# Patient Record
Sex: Female | Born: 1995 | Race: Black or African American | Hispanic: Yes | Marital: Single
Health system: Southern US, Community
[De-identification: ages and names within clinical notes are randomized; demographics above are authoritative.]

## PROBLEM LIST (undated history)

## (undated) ENCOUNTER — Inpatient Hospital Stay (HOSPITAL_COMMUNITY): Payer: Self-pay

## (undated) DIAGNOSIS — J45909 Unspecified asthma, uncomplicated: Secondary | ICD-10-CM

## (undated) DIAGNOSIS — A6000 Herpesviral infection of urogenital system, unspecified: Secondary | ICD-10-CM

## (undated) HISTORY — PX: CYST REMOVAL LEG: SHX6280

---
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
## 2016-05-13 ENCOUNTER — Ambulatory Visit (INDEPENDENT_AMBULATORY_CARE_PROVIDER_SITE_OTHER): Payer: Medicaid Other | Admitting: *Deleted

## 2016-05-13 DIAGNOSIS — N912 Amenorrhea, unspecified: Secondary | ICD-10-CM

## 2016-05-13 DIAGNOSIS — Z3202 Encounter for pregnancy test, result negative: Secondary | ICD-10-CM | POA: Diagnosis not present

## 2016-05-13 LAB — POCT URINE PREGNANCY: Preg Test, Ur: NEGATIVE

## 2016-05-13 NOTE — Progress Notes (Signed)
Patient is not using birth control- never used birth control. She has always had normal cycle. She is 32 days late with negative home test. Her test in our office is negative also. Advised she needs appointment with gyn.

## 2016-05-30 ENCOUNTER — Ambulatory Visit: Payer: Medicaid Other | Admitting: Certified Nurse Midwife

## 2016-06-27 ENCOUNTER — Encounter (HOSPITAL_COMMUNITY): Payer: Self-pay

## 2016-06-27 ENCOUNTER — Emergency Department (HOSPITAL_COMMUNITY)
Admission: EM | Admit: 2016-06-27 | Discharge: 2016-06-27 | Disposition: A | Discharge status: Home / Self Care | Payer: Medicaid Other | Attending: Emergency Medicine | Admitting: Emergency Medicine

## 2016-06-27 ENCOUNTER — Emergency Department (HOSPITAL_COMMUNITY): Payer: Medicaid Other

## 2016-06-27 DIAGNOSIS — Z79899 Other long term (current) drug therapy: Secondary | ICD-10-CM | POA: Diagnosis not present

## 2016-06-27 DIAGNOSIS — Z7722 Contact with and (suspected) exposure to environmental tobacco smoke (acute) (chronic): Secondary | ICD-10-CM | POA: Diagnosis not present

## 2016-06-27 DIAGNOSIS — R05 Cough: Secondary | ICD-10-CM | POA: Insufficient documentation

## 2016-06-27 DIAGNOSIS — J45909 Unspecified asthma, uncomplicated: Secondary | ICD-10-CM | POA: Diagnosis not present

## 2016-06-27 DIAGNOSIS — R059 Cough, unspecified: Secondary | ICD-10-CM

## 2016-06-27 MED ORDER — ALBUTEROL SULFATE HFA 108 (90 BASE) MCG/ACT IN AERS
2.0000 | INHALATION_SPRAY | Freq: Once | RESPIRATORY_TRACT | Status: AC
  Administered 2016-06-27: 2 via RESPIRATORY_TRACT
  Filled 2016-06-27: qty 6.7

## 2016-06-27 MED ORDER — LORATADINE-PSEUDOEPHEDRINE ER 5-120 MG PO TB12: 1.0000 | ORAL_TABLET | Freq: Two times a day (BID) | ORAL | 0 refills | Status: DC

## 2016-06-27 NOTE — ED Triage Notes (Signed)
Per Pt, Pt reports having cough x 2 months with small productive cough with some mucous. Denies fever. Reports OTC medication with no relief.

## 2016-06-27 NOTE — ED Provider Notes (Signed)
[facility]EM[facility]ENCY DEPT Provider Note   CSN: [id number] Arrival date & time: 06/27/16  1730  By signing my name below, I, Teofilo Pod, attest that this documentation has been prepared under the direction and in the presence of Rudean Icenhour, PA-C. Electronically Signed: Teofilo Pod, ED Scribe. 06/27/2016. 7:34 PM.    History   Chief Complaint Chief Complaint  Patient presents with  . Cough   The history is provided by the patient. No language interpreter was used.   HPI Comments:  Angel Daugherty is a 21 y.o. female with PMHx of asthma who presents to the Emergency Department complaining of a persistent cough x 2 month. Pt complains of associated intermittent rhinorrhea, congestion, and sore throat but states that she is not having rhinorrhea at this time. States sore throat worse with coughing. Denies fever, chills, malaise. Cough is non productive. Pt does not smoke. States exposed to 2nd hand smoke. Pt has taken multiple OTC cough medications and tylenol PM with no relief. No sick contacts. States symptoms started with flu like symptoms. Denies acid reflux. No hx of the same. Nothing making her symptoms better or worse.   History reviewed. No pertinent past medical history.  There are no active problems to display for this patient.   Past Surgical History:  Procedure Laterality Date  . CYST REMOVAL LEG    . PILONIDAL CYST EXCISION  August 2016  . PILONIDAL CYST EXCISION N/A 05/18/2015   Procedure: REXCISION OF PILONIDAL CYST ;  Surgeon: Glenna Fellows, MD;  Location: [facility];  Service: General;  Laterality: N/A;    OB History    Gravida Para Term Preterm AB Living   1             SAB TAB Ectopic Multiple Live Births                   Home Medications    Prior to Admission medications   Medication Sig Start Date End Date Taking? Authorizing Provider  amoxicillin (AMOXIL) 500 MG capsule Take 500 mg by mouth 2 (two) times daily. 09/27/15   Historical  Provider, MD  HYDROcodone-acetaminophen (NORCO/VICODIN) 5-325 MG tablet Take 1 tablet by mouth 4 (four) times daily. 09/27/15   Historical Provider, MD  oxyCODONE-acetaminophen (PERCOCET/ROXICET) 5-325 MG tablet Take 1 tablet by mouth every 4 (four) hours as needed for severe pain. 10/02/15   Cheri Fowler, PA-C  permethrin (ELIMITE) 5 % cream Apply 1 application topically once. Apply from the neck down coating your body and all the crevices. Do not apply to your face. Repeat the process in 1 week. Patient not taking: Reported on 10/02/2015 09/12/15   Arthor Captain, PA-C  Prenatal Vit-Fe Phos-FA-Omega (VITAFOL GUMMIES) 3.33-0.333-34.8 MG CHEW Chew 3 tablets by mouth daily. 08/28/15   Historical Provider, MD  triamcinolone cream (KENALOG) 0.1 % Apply 1 application topically 2 (two) times daily. 09/12/15   Arthor Captain, PA-C    Family History No family history on file.  Social History Social History  Substance Use Topics  . Smoking status: Passive Smoke Exposure - Never Smoker    Years: 18.00  . Smokeless tobacco: Never Used  . Alcohol use No     Allergies   Hydrocodone   Review of Systems Review of Systems  Constitutional: Negative for chills and fever.  HENT: Positive for congestion, rhinorrhea and sore throat.   Respiratory: Positive for cough.   Musculoskeletal: Negative for myalgias.  Neurological: Negative for headaches.  Physical Exam Updated Vital Signs BP 129/77 (BP Location: Left Arm)   Pulse 86   Temp 98.7 F (37.1 C) (Oral)   Resp 18   Ht 5\' 1"  (1.549 m)   Wt 150 lb (68 kg)   LMP 03/06/2016 (Within Weeks)   SpO2 100%   BMI 28.34 kg/m   Physical Exam  Constitutional: She is oriented to person, place, and time. She appears well-developed and well-nourished. No distress.  HENT:  Head: Normocephalic and atraumatic.  Right Ear: Tympanic membrane, external ear and ear canal normal.  Left Ear: Tympanic membrane, external ear and ear canal normal.  Nose:  Mucosal edema and rhinorrhea present.  Mouth/Throat: Uvula is midline and mucous membranes are normal. No oropharyngeal exudate, posterior oropharyngeal edema, posterior oropharyngeal erythema or tonsillar abscesses.  Eyes: Conjunctivae are normal.  Neck: Neck supple.  Cardiovascular: Normal rate, regular rhythm, normal heart sounds and intact distal pulses.   Pulmonary/Chest: Effort normal and breath sounds normal. No respiratory distress. She has no wheezes. She has no rales.  Abdominal: She exhibits no distension.  Musculoskeletal: Normal range of motion.  Neurological: She is alert and oriented to person, place, and time.  Skin: Skin is warm and dry.  Psychiatric: She has a normal mood and affect.  Nursing note and vitals reviewed.    ED Treatments / Results  DIAGNOSTIC STUDIES:  Oxygen Saturation is 100% on RA, normal by my interpretation.    COORDINATION OF CARE:  7:34 PM Discussed treatment plan with pt at bedside and pt agreed to plan.   Labs (all labs ordered are listed, but only abnormal results are displayed) Labs Reviewed - No data to display  EKG  EKG Interpretation None       Radiology Dg Chest 2 View  Result Date: 06/27/2016 CLINICAL DATA:  Consistent cough occasionally productive EXAM: CHEST  2 VIEW COMPARISON:  08/10/2004 FINDINGS: The heart size and mediastinal contours are within normal limits. Both lungs are clear. The visualized skeletal structures are unremarkable. IMPRESSION: No active cardiopulmonary disease. Electronically Signed   By: Jasmine PangKim  Fujinaga M.D.   On: 06/27/2016 19:18    Procedures Procedures (including critical care time)  Medications Ordered in ED Medications - No data to display   Initial Impression / Assessment and Plan / ED Course  I have reviewed the triage vital signs and the nursing notes.  Pertinent labs & imaging results that were available during my care of the patient were reviewed by me and considered in my medical  decision making (see chart for details).  Clinical Course   Patient in emergency department for cough for 2 months. Reports some nasal congestion, otherwise no other associated symptoms. Vital signs are normal. No wheezing on exam, lungs are clear. Chest x-ray obtained and is negative. Question postnasal drainage as the cause of her symptoms versus acid reflux. Instructed to cut out spicy foods, sleep propped up on the pillows, will start on Claritin-D and inhaler. Follow with primary care doctor if not improving. Patient is not toxic appearing, stable for discharge home. No chest pain, shortness of breath, no fever, not hypoxic, not tachycardic,  PERC negative.   Vitals:   06/27/16 1826  BP: 129/77  Pulse: 86  Resp: 18  Temp: 98.7 F (37.1 C)  TempSrc: Oral  SpO2: 100%  Weight: 68 kg  Height: 5\' 1"  (1.549 m)    Final Clinical Impressions(s) / ED Diagnoses   Final diagnoses:  Cough    New Prescriptions New Prescriptions  LORATADINE-PSEUDOEPHEDRINE (CLARITIN-D 12 HOUR) 5-120 MG TABLET    Take 1 tablet by mouth 2 (two) times daily.   I personally performed the services described in this documentation, which was scribed in my presence. The recorded information has been reviewed and is accurate.     Jaynie Crumble, PA-C 06/27/16 1950    Canary Brim Tegeler, MD 06/27/16 2249

## 2016-06-27 NOTE — ED Notes (Signed)
Patient transported to X-ray 

## 2016-06-27 NOTE — Discharge Instructions (Signed)
Take inhaler 2 puffs every 4 hrs. Claritin D for congestion and post nasal drainage. Follow up with primary care doctor if not improving.

## 2016-12-10 ENCOUNTER — Ambulatory Visit: Payer: Medicaid Other | Admitting: Obstetrics

## 2017-06-17 DIAGNOSIS — A6000 Herpesviral infection of urogenital system, unspecified: Secondary | ICD-10-CM

## 2017-06-17 HISTORY — DX: Herpesviral infection of urogenital system, unspecified: A60.00

## 2017-08-16 ENCOUNTER — Emergency Department (HOSPITAL_COMMUNITY): Payer: Medicaid Other

## 2017-08-16 ENCOUNTER — Emergency Department (HOSPITAL_COMMUNITY)
Admission: EM | Admit: 2017-08-16 | Discharge: 2017-08-16 | Disposition: A | Discharge status: Home / Self Care | Payer: Medicaid Other | Attending: Emergency Medicine | Admitting: Emergency Medicine

## 2017-08-16 ENCOUNTER — Encounter (HOSPITAL_COMMUNITY): Payer: Self-pay

## 2017-08-16 DIAGNOSIS — R197 Diarrhea, unspecified: Secondary | ICD-10-CM | POA: Diagnosis not present

## 2017-08-16 DIAGNOSIS — J069 Acute upper respiratory infection, unspecified: Secondary | ICD-10-CM | POA: Diagnosis not present

## 2017-08-16 DIAGNOSIS — Z7722 Contact with and (suspected) exposure to environmental tobacco smoke (acute) (chronic): Secondary | ICD-10-CM | POA: Diagnosis not present

## 2017-08-16 DIAGNOSIS — J45909 Unspecified asthma, uncomplicated: Secondary | ICD-10-CM | POA: Diagnosis not present

## 2017-08-16 DIAGNOSIS — R112 Nausea with vomiting, unspecified: Secondary | ICD-10-CM

## 2017-08-16 DIAGNOSIS — R05 Cough: Secondary | ICD-10-CM | POA: Diagnosis present

## 2017-08-16 DIAGNOSIS — B9789 Other viral agents as the cause of diseases classified elsewhere: Secondary | ICD-10-CM | POA: Diagnosis not present

## 2017-08-16 DIAGNOSIS — Z79899 Other long term (current) drug therapy: Secondary | ICD-10-CM | POA: Insufficient documentation

## 2017-08-16 HISTORY — DX: Unspecified asthma, uncomplicated: J45.909

## 2017-08-16 LAB — COMPREHENSIVE METABOLIC PANEL
ALT: 14 U/L (ref 14–54)
AST: 18 U/L (ref 15–41)
Albumin: 3.9 g/dL (ref 3.5–5.0)
Alkaline Phosphatase: 65 U/L (ref 38–126)
Anion gap: 12 (ref 5–15)
BUN: 8 mg/dL (ref 6–20)
CO2: 21 mmol/L — ABNORMAL LOW (ref 22–32)
Calcium: 9 mg/dL (ref 8.9–10.3)
Chloride: 103 mmol/L (ref 101–111)
Creatinine, Ser: 0.68 mg/dL (ref 0.44–1.00)
GFR calc Af Amer: >60 mL/min (ref >60)
GFR calc non Af Amer: >60 mL/min (ref >60)
Glucose, Bld: 88 mg/dL (ref 65–99)
Potassium: 3.6 mmol/L (ref 3.5–5.1)
Sodium: 136 mmol/L (ref 135–145)
Total Bilirubin: 0.7 mg/dL (ref 0.3–1.2)
Total Protein: 8.1 g/dL (ref 6.5–8.1)

## 2017-08-16 LAB — CBC WITH DIFFERENTIAL/PLATELET
Basophils Absolute: 0 10*3/uL (ref 0.0–0.1)
Basophils Relative: 0 %
Eosinophils Absolute: 0 10*3/uL (ref 0.0–0.7)
Eosinophils Relative: 0 %
HCT: 36 % (ref 36.0–46.0)
Hemoglobin: 12.1 g/dL (ref 12.0–15.0)
Lymphocytes Relative: 9 %
Lymphs Abs: 0.9 10*3/uL (ref 0.7–4.0)
MCH: 30 pg (ref 26.0–34.0)
MCHC: 33.6 g/dL (ref 30.0–36.0)
MCV: 89.1 fL (ref 78.0–100.0)
Monocytes Absolute: 1.1 10*3/uL — ABNORMAL HIGH (ref 0.1–1.0)
Monocytes Relative: 11 %
Neutro Abs: 7.8 10*3/uL — ABNORMAL HIGH (ref 1.7–7.7)
Neutrophils Relative %: 80 %
Platelets: 263 10*3/uL (ref 150–400)
RBC: 4.04 MIL/uL (ref 3.87–5.11)
RDW: 12.4 % (ref 11.5–15.5)
WBC: 9.7 10*3/uL (ref 4.0–10.5)

## 2017-08-16 LAB — POC URINE PREG, ED: Preg Test, Ur: NEGATIVE

## 2017-08-16 MED ORDER — SODIUM CHLORIDE 0.9 % IV BOLUS (SEPSIS)
1000.0000 mL | Freq: Once | INTRAVENOUS | Status: AC
  Administered 2017-08-16: 1000 mL via INTRAVENOUS

## 2017-08-16 MED ORDER — ONDANSETRON 4 MG PO TBDP: 4.0000 mg | ORAL_TABLET | Freq: Three times a day (TID) | ORAL | 0 refills | Status: DC | PRN

## 2017-08-16 MED ORDER — ONDANSETRON HCL 4 MG/2ML IJ SOLN
4.0000 mg | Freq: Once | INTRAMUSCULAR | Status: AC
  Administered 2017-08-16: 4 mg via INTRAVENOUS
  Filled 2017-08-16: qty 2

## 2017-08-16 MED ORDER — ONDANSETRON 4 MG PO TBDP: 4.0000 mg | ORAL_TABLET | Freq: Once | ORAL | Status: DC

## 2017-08-16 MED ORDER — BENZONATATE 100 MG PO CAPS: 100.0000 mg | ORAL_CAPSULE | Freq: Three times a day (TID) | ORAL | 0 refills | Status: DC

## 2017-08-16 MED ORDER — IPRATROPIUM-ALBUTEROL 0.5-2.5 (3) MG/3ML IN SOLN
3.0000 mL | Freq: Once | RESPIRATORY_TRACT | Status: AC
  Administered 2017-08-16: 3 mL via RESPIRATORY_TRACT
  Filled 2017-08-16: qty 3

## 2017-08-16 NOTE — Discharge Instructions (Signed)
Please read instructions below. Drink clear liquids until your stomach feels better. Then, slowly introduce bland foods into your diet as tolerated, such as bread, rice, apples, bananas. You can take zofran every 8 hours as needed for nausea. You can take tylenol or advil as needed for sore throat or body aches.  You can take tessalon every 8 hours as needed for cough. Follow up with your primary care if symptoms persist. Return to the ER for severe abdominal pain, fever, uncontrollable vomiting, or new or concerning symptoms.

## 2017-08-16 NOTE — ED Triage Notes (Signed)
Patient complains of 3 days of cough with nausea and vomiting. Complains of fatigue and weakness. Alert and oriented.

## 2017-08-16 NOTE — ED Provider Notes (Signed)
[facility] Provider Note   CSN: [id number] Arrival date & time: 08/16/17  0840     History   Chief Complaint No chief complaint on file.   HPI Angel Daugherty is a 22 y.o. female with past medical history of asthma, presenting to the ED with 3 days of cough, congestion, as well as nausea vomiting and diarrhea.  Patient states she does have positive sick contacts at home.  She has had hot and cold flashes as well, however has not taken her temperature.  Taking over-the-counter medications for URI symptoms without much relief.  Dates she initially had a sore throat which has resolved.  Has also been using her albuterol inhaler as prescribed.  Denies shortness of breath, abdominal pain, urinary symptoms, difficulty swallowing or breathing, ear pain, or other complaints.  Has not had influenza vaccine this season.  The history is provided by the patient.    Past Medical History:  Diagnosis Date  . Asthma     There are no active problems to display for this patient.   Past Surgical History:  Procedure Laterality Date  . CYST REMOVAL LEG    . PILONIDAL CYST EXCISION  August 2016  . PILONIDAL CYST EXCISION N/A 05/18/2015   Procedure: REXCISION OF PILONIDAL CYST ;  Surgeon: Glenna FellowsBenjamin Hoxworth, MD;  Location: [facility];  Service: General;  Laterality: N/A;    OB History    Gravida Para Term Preterm AB Living   1             SAB TAB Ectopic Multiple Live Births                   Home Medications    Prior to Admission medications   Medication Sig Start Date End Date Taking? Authorizing Provider  amoxicillin (AMOXIL) 500 MG capsule Take 500 mg by mouth 2 (two) times daily. 09/27/15   [provider]  benzonatate (TESSALON) 100 MG capsule Take 1 capsule (100 mg total) by mouth every 8 (eight) hours. 08/16/17   Aleshia Cartelli, [place] N, PA-C  HYDROcodone-acetaminophen (NORCO/VICODIN) 5-325 MG tablet Take 1 tablet by mouth 4 (four) times daily.  09/27/15   [provider]  loratadine-pseudoephedrine (CLARITIN-D 12 HOUR) 5-120 MG tablet Take 1 tablet by mouth 2 (two) times daily. 06/27/16   Kirichenko, Tatyana, PA-C  ondansetron (ZOFRAN ODT) 4 MG disintegrating tablet Take 1 tablet (4 mg total) by mouth every 8 (eight) hours as needed for nausea or vomiting. 08/16/17   Brodric Schauer, [place] N, PA-C  oxyCODONE-acetaminophen (PERCOCET/ROXICET) 5-325 MG tablet Take 1 tablet by mouth every 4 (four) hours as needed for severe pain. 10/02/15   Cheri Fowlerose, Kayla, PA-C  permethrin (ELIMITE) 5 % cream Apply 1 application topically once. Apply from the neck down coating your body and all the crevices. Do not apply to your face. Repeat the process in 1 week. Patient not taking: Reported on 10/02/2015 09/12/15   Arthor CaptainHarris, Abigail, PA-C  Prenatal Vit-Fe Phos-FA-Omega (VITAFOL GUMMIES) 3.33-0.333-34.8 MG CHEW Chew 3 tablets by mouth daily. 08/28/15   [provider]  triamcinolone cream (KENALOG) 0.1 % Apply 1 application topically 2 (two) times daily. 09/12/15   Arthor CaptainHarris, Abigail, PA-C    Family History No family history on file.  Social History Social History   Tobacco Use  . Smoking status: Passive Smoke Exposure - Never Smoker  . Smokeless tobacco: Never Used  Substance Use Topics  . Alcohol use: No  . Drug use: No  Allergies   Hydrocodone   Review of Systems Review of Systems  Constitutional: Positive for chills and fever (Subjective).  HENT: Positive for congestion and sore throat. Negative for ear pain, trouble swallowing and voice change.   Respiratory: Positive for cough. Negative for shortness of breath.   Gastrointestinal: Positive for diarrhea, nausea and vomiting. Negative for abdominal pain and blood in stool.  Genitourinary: Negative for dysuria and frequency.  All other systems reviewed and are negative.    Physical Exam Updated Vital Signs BP (!) 125/59 (BP Location: Right Arm)   Pulse (!) 117   Temp 99.8 F (37.7  C) (Oral)   Resp 18   LMP 07/17/2017 (Approximate)   SpO2 97%   Physical Exam  Constitutional: She appears well-developed and well-nourished. No distress.  HENT:  Head: Normocephalic and atraumatic.  Right Ear: Tympanic membrane, external ear and ear canal normal.  Left Ear: Tympanic membrane, external ear and ear canal normal.  Mouth/Throat: Uvula is midline and oropharynx is clear and moist. No trismus in the jaw. No uvula swelling.  Nose is congested  Eyes: Conjunctivae are normal.  Neck: Normal range of motion. Neck supple.  Cardiovascular: Regular rhythm, normal heart sounds and intact distal pulses.  Mildly tachycardic.  Pulmonary/Chest: Effort normal and breath sounds normal. No stridor. No respiratory distress. She has no wheezes. She has no rales.  Abdominal: Soft. Bowel sounds are normal. She exhibits no distension. There is no tenderness. There is no rebound and no guarding.  Lymphadenopathy:    She has no cervical adenopathy.  Neurological: She is alert.  Skin: Skin is warm.  Psychiatric: She has a normal mood and affect. Her behavior is normal.  Nursing note and vitals reviewed.    ED Treatments / Results  Labs (all labs ordered are listed, but only abnormal results are displayed) Labs Reviewed  COMPREHENSIVE METABOLIC PANEL - Abnormal; Notable for the following components:      Result Value   CO2 21 (*)    All other components within normal limits  CBC WITH DIFFERENTIAL/PLATELET - Abnormal; Notable for the following components:   Neutro Abs 7.8 (*)    Monocytes Absolute 1.1 (*)    All other components within normal limits  POC URINE PREG, ED    EKG  EKG Interpretation None       Radiology Dg Chest 2 View  Result Date: 08/16/2017 CLINICAL DATA:  Cough for 3 days. EXAM: CHEST  2 VIEW COMPARISON:  None. FINDINGS: June 27, 2016 IMPRESSION: No active cardiopulmonary disease. Electronically Signed   By: Gerome Sam III M.D   On: 08/16/2017 10:00     Procedures Procedures (including critical care time)  Medications Ordered in ED Medications  ipratropium-albuterol (DUONEB) 0.5-2.5 (3) MG/3ML nebulizer solution 3 mL (3 mLs Nebulization Given 08/16/17 1007)  sodium chloride 0.9 % bolus 1,000 mL (0 mLs Intravenous Stopped 08/16/17 1052)  ondansetron (ZOFRAN) injection 4 mg (4 mg Intravenous Given 08/16/17 1007)     Initial Impression / Assessment and Plan / ED Course  I have reviewed the triage vital signs and the nursing notes.  Pertinent labs & imaging results that were available during my care of the patient were reviewed by me and considered in my medical decision making (see chart for details).     Patient presenting with URI symptoms and vomiting and diarrhea.  Suspect viral etiology of symptoms.  Afebrile, however with tachycardia on arrival.  Lungs clear to auscultation bilaterally, ENT exam unremarkable.  Abdomen  soft and nontender.  DuoNeb given with improvement in cough.  CBC without leukocytosis.  CMP unremarkable.  Chest x-ray negative for acute infiltrate.  Patient treated in the ED with IV fluids and Zofran.  Patient reports significant relief and nausea.  Only complaint is persistent cough.  Tachycardia improved following fluids.  Patient is well-appearing, nontoxic, not in distress.  Patient is safe for discharge with symptomatic treatment and follow-up with her PCP.  discussed importance of oral rehydration, will also send with cough suppressant.  Discussed results, findings, treatment and follow up. Patient advised of return precautions. Patient verbalized understanding and agreed with plan.  Final Clinical Impressions(s) / ED Diagnoses   Final diagnoses:  Viral URI with cough  Nausea vomiting and diarrhea    ED Discharge Orders        Ordered    benzonatate (TESSALON) 100 MG capsule  Every 8 hours     08/16/17 1154    ondansetron (ZOFRAN ODT) 4 MG disintegrating tablet  Every 8 hours PRN     08/16/17 1154        Weylyn Ricciuti, [place] N, PA-C 08/16/17 1155    Rolan Bucco, MD 08/16/17 1159

## 2018-01-16 IMAGING — CT CT PELVIS W/ CM
2 of 3 series · 15 of 46 positions shown, 17 images · IV contrast (iopamidol)
Comparison: None.

CLINICAL DATA: Cellulitis/abscess left buttock. History of cyst in
that region.

EXAM:
CT PELVIS WITH CONTRAST
TECHNIQUE: Multidetector CT imaging of the pelvis was performed using the
standard protocol following the bolus administration of intravenous
contrast.
CONTRAST:  100mL 23PZOY-U77 IOPAMIDOL ([id number]-U77) INJECTION 61%

[Series 3: pelvis with · axial · 0.87mm/px · z∈[-332,-132]mm · 12 of 48 slices shown, 14 images]
[im 4/48  soft-tissue]
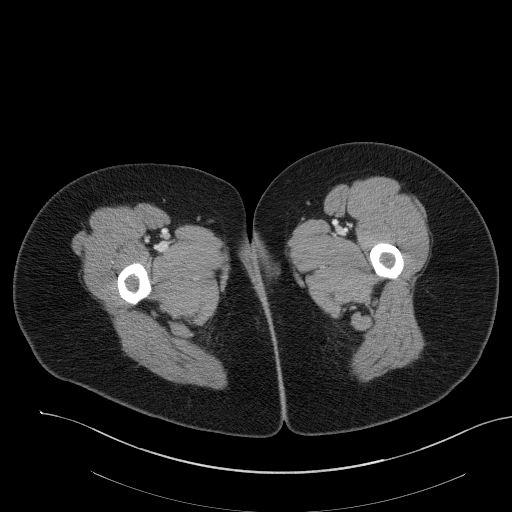
[im 4/48  bone]
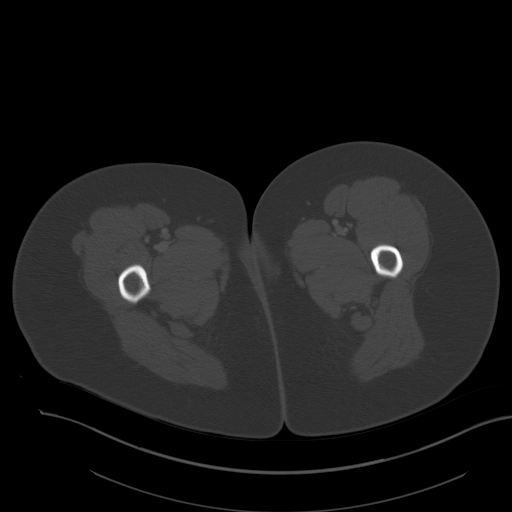
[im 7/48  soft-tissue]
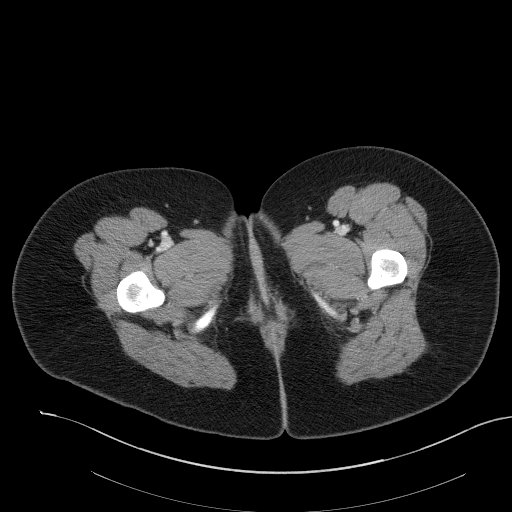
[im 11/48  soft-tissue]
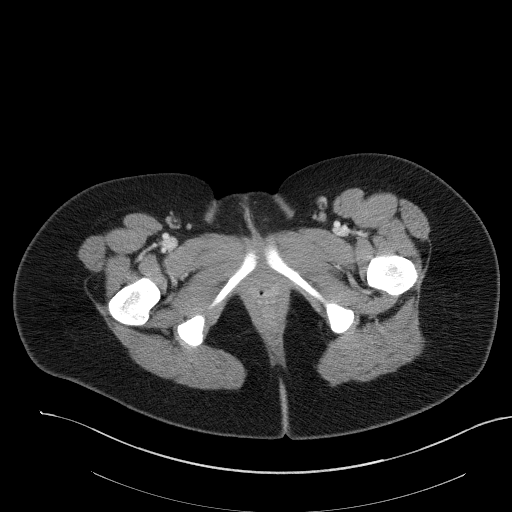
[im 14/48  soft-tissue]
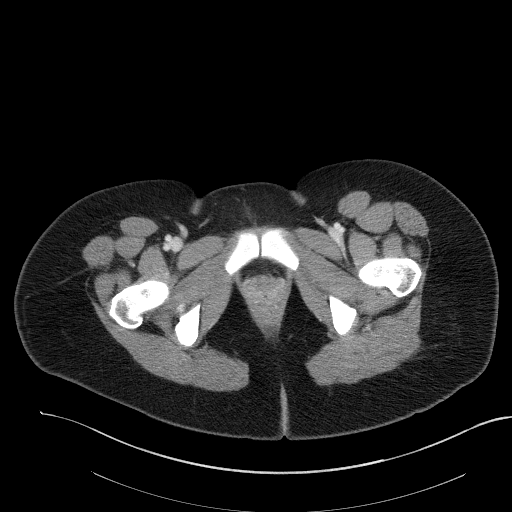
[im 19/48  soft-tissue]
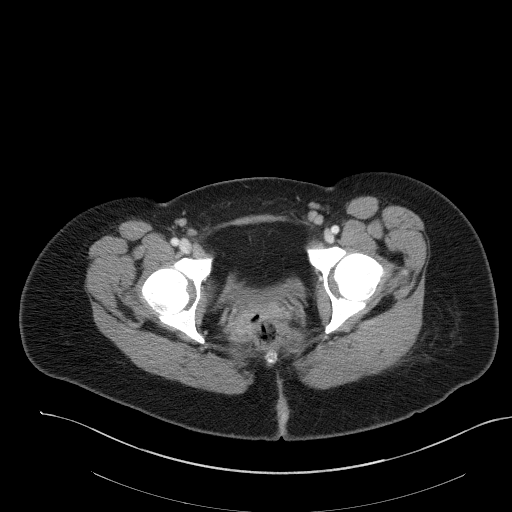
[im 22/48  soft-tissue]
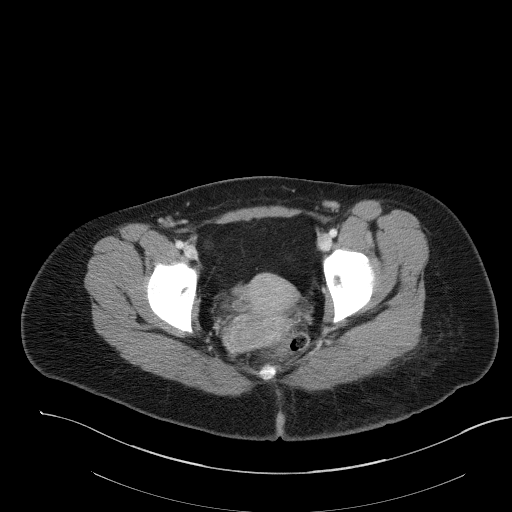
[im 26/48  soft-tissue]
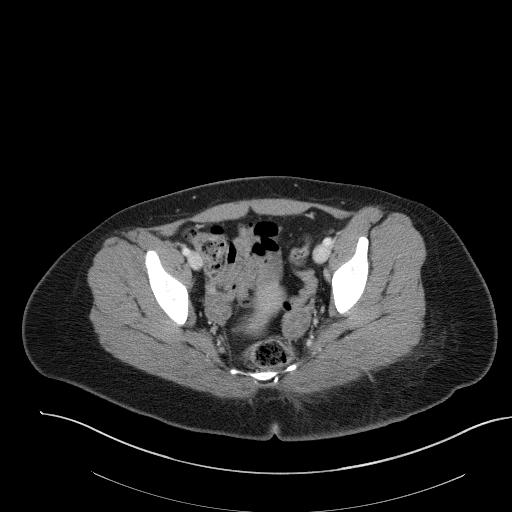
[im 29/48  soft-tissue]
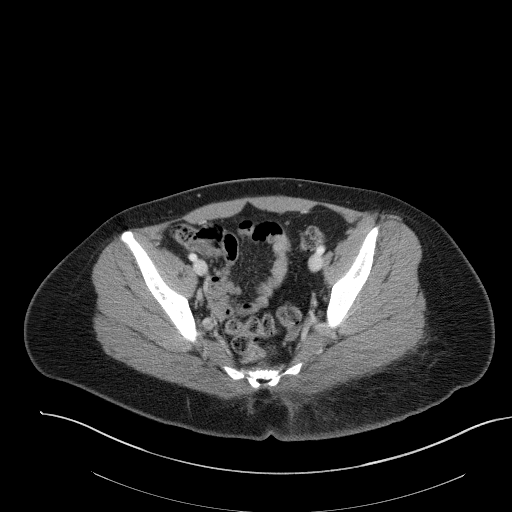
[im 34/48  soft-tissue]
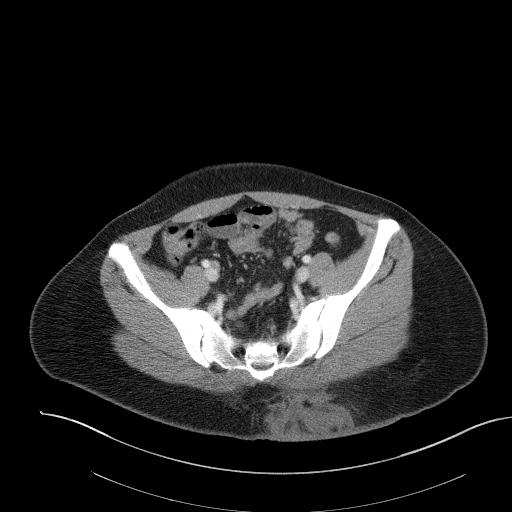
[im 34/48  bone]
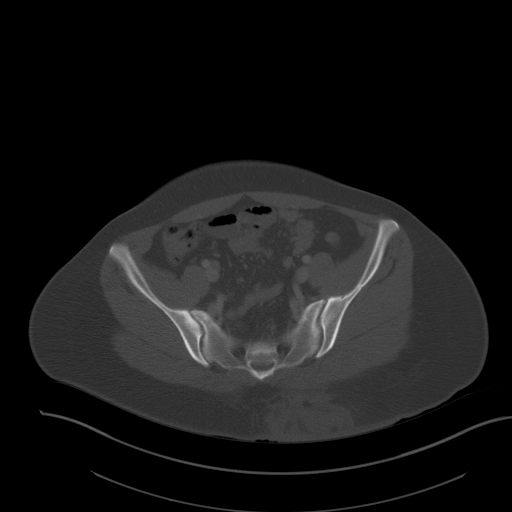
[im 37/48  soft-tissue]
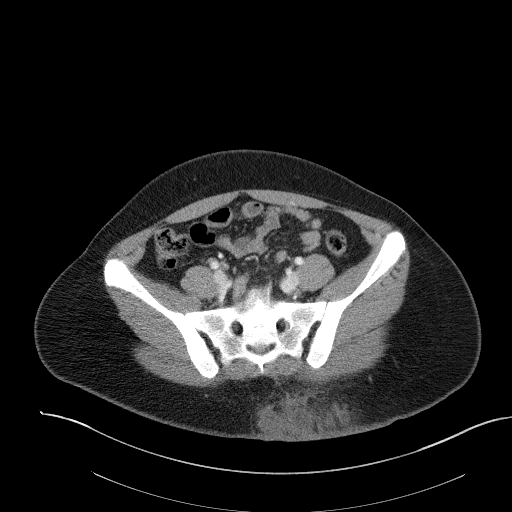
[im 41/48  soft-tissue]
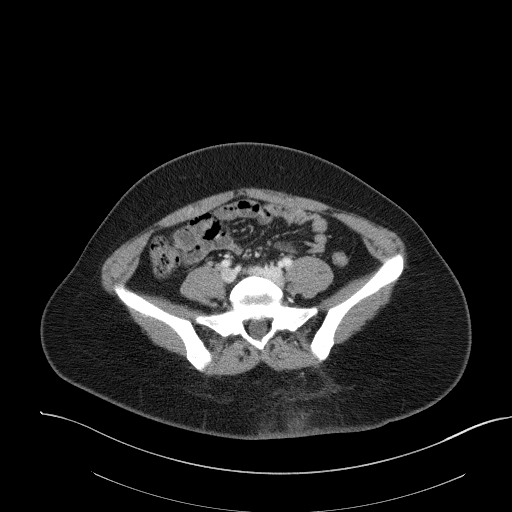
[im 44/48  soft-tissue]
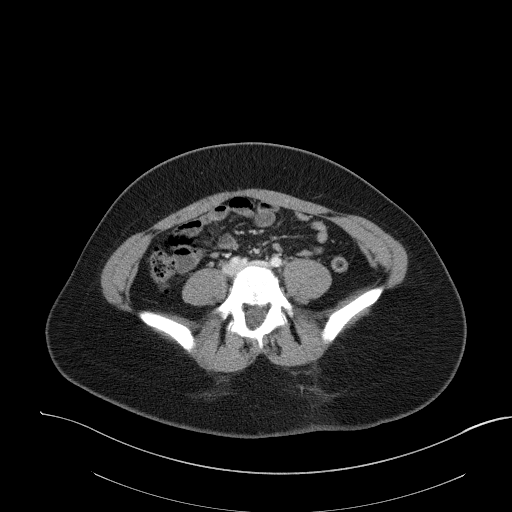

[Series 4: coronal images · coronal · 0.52mm/px · 3 of 129 slices shown]
[im 43/129  soft-tissue]
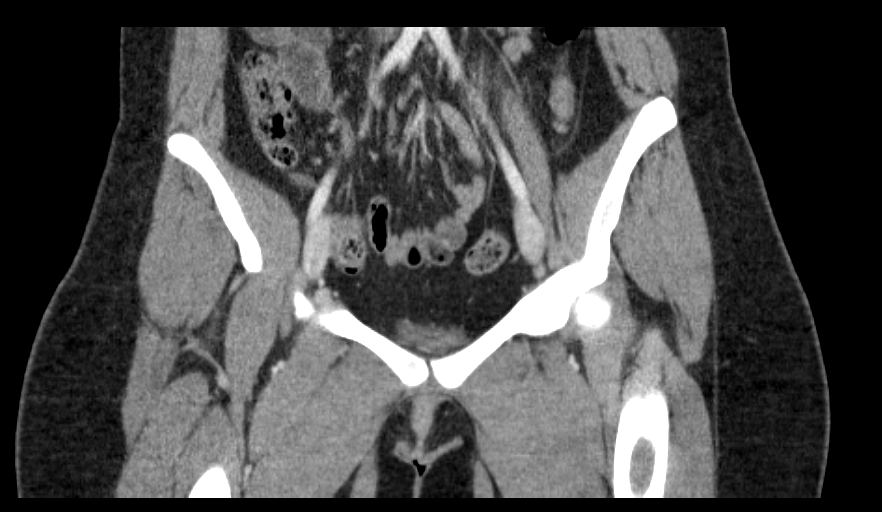
[im 57/129  soft-tissue]
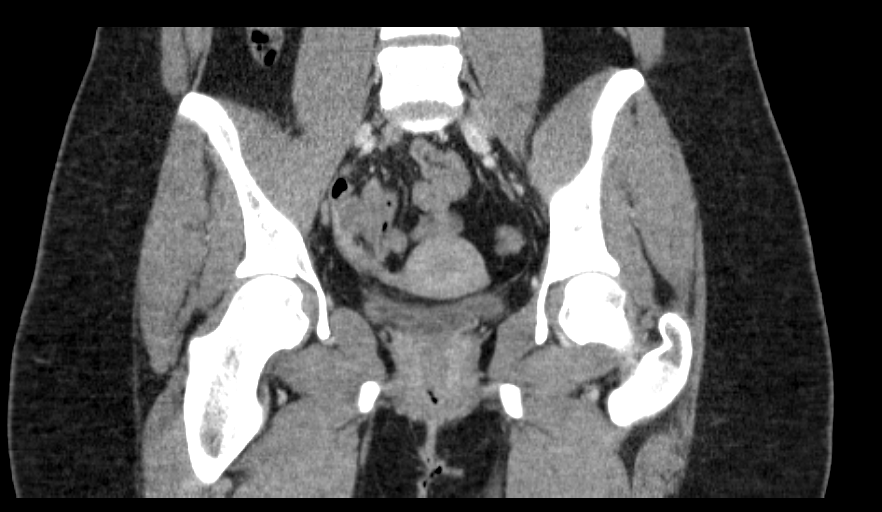
[im 72/129  soft-tissue]
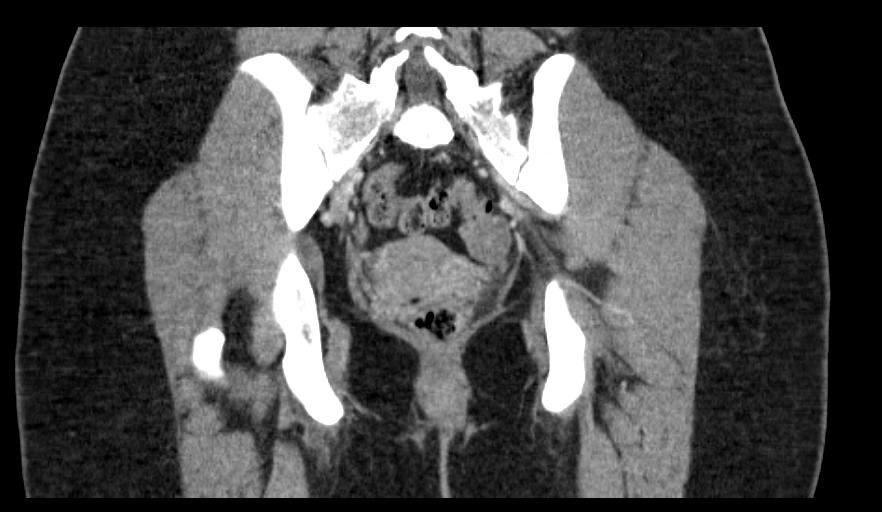

[15 of 46 positions shown; findings below may reference images not displayed]

FINDINGS: There is nonspecific indistinct fluid or soft tissue density
abnormality within the subcutaneous fat at the left buttock region
involving an area measuring approximately 6.6 cm cephalocaudal, 8 cm
right to left, and 5 cm front to back. This is primarily restricted
to the subcutaneous fatty tissues. There could be superficial
involvement/inflammation of the left gluteus musculature. Certainly
there is no evidence of deep extension or intramuscular abscess.
Bones of the region appear normal. No internal pelvic abnormality
seen. No evidence of radiopaque foreign object.
IMPRESSION: Nonspecific indistinctly marginated fluid or soft tissue density
material within the subcutaneous fat of the left buttock. Most
common explanations would be hematoma or cellulitis. No evidence of
well circumscribed drainable collection. Question mild inflammation
of the left gluteal musculature superficially. There was some
question in the history about a pre-existing lesion in this region
and there could certainly be an underlying hemangioma or other soft
tissue abnormality complicated by acute regional hemorrhage or
inflammation.

## 2018-03-25 ENCOUNTER — Inpatient Hospital Stay (HOSPITAL_COMMUNITY)
Admission: AD | Admit: 2018-03-25 | Discharge: 2018-03-25 | Disposition: A | Discharge status: Home / Self Care | Payer: Medicaid Other | Source: Ambulatory Visit | Attending: Obstetrics & Gynecology | Admitting: Obstetrics & Gynecology

## 2018-03-25 ENCOUNTER — Other Ambulatory Visit: Payer: Self-pay

## 2018-03-25 ENCOUNTER — Inpatient Hospital Stay (HOSPITAL_COMMUNITY): Payer: Medicaid Other

## 2018-03-25 ENCOUNTER — Encounter (HOSPITAL_COMMUNITY): Payer: Self-pay | Admitting: *Deleted

## 2018-03-25 DIAGNOSIS — Z3A01 Less than 8 weeks gestation of pregnancy: Secondary | ICD-10-CM | POA: Insufficient documentation

## 2018-03-25 DIAGNOSIS — R102 Pelvic and perineal pain: Secondary | ICD-10-CM

## 2018-03-25 DIAGNOSIS — B9689 Other specified bacterial agents as the cause of diseases classified elsewhere: Secondary | ICD-10-CM | POA: Diagnosis not present

## 2018-03-25 DIAGNOSIS — Z7722 Contact with and (suspected) exposure to environmental tobacco smoke (acute) (chronic): Secondary | ICD-10-CM | POA: Diagnosis not present

## 2018-03-25 DIAGNOSIS — R109 Unspecified abdominal pain: Secondary | ICD-10-CM | POA: Diagnosis present

## 2018-03-25 DIAGNOSIS — O23591 Infection of other part of genital tract in pregnancy, first trimester: Secondary | ICD-10-CM | POA: Diagnosis not present

## 2018-03-25 DIAGNOSIS — R1030 Lower abdominal pain, unspecified: Secondary | ICD-10-CM | POA: Diagnosis not present

## 2018-03-25 DIAGNOSIS — O26891 Other specified pregnancy related conditions, first trimester: Secondary | ICD-10-CM | POA: Diagnosis not present

## 2018-03-25 DIAGNOSIS — N760 Acute vaginitis: Secondary | ICD-10-CM

## 2018-03-25 DIAGNOSIS — O26899 Other specified pregnancy related conditions, unspecified trimester: Secondary | ICD-10-CM

## 2018-03-25 HISTORY — DX: Herpesviral infection of urogenital system, unspecified: A60.00

## 2018-03-25 LAB — CBC WITH DIFFERENTIAL/PLATELET
Basophils Absolute: 0 10*3/uL (ref 0.0–0.1)
Basophils Relative: 0 %
Eosinophils Absolute: 0 10*3/uL (ref 0.0–0.5)
Eosinophils Relative: 0 %
HCT: 36.3 % (ref 36.0–46.0)
Hemoglobin: 12.6 g/dL (ref 12.0–15.0)
Lymphocytes Relative: 23 %
Lymphs Abs: 1.8 10*3/uL (ref 0.7–4.0)
MCH: 29.9 pg (ref 26.0–34.0)
MCHC: 34.7 g/dL (ref 30.0–36.0)
MCV: 86.2 fL (ref 80.0–100.0)
Monocytes Absolute: 0.3 10*3/uL (ref 0.1–1.0)
Monocytes Relative: 4 %
Neutro Abs: 5.7 10*3/uL (ref 1.7–7.7)
Neutrophils Relative %: 73 %
Platelets: 309 10*3/uL (ref 150–400)
RBC: 4.21 MIL/uL (ref 3.87–5.11)
RDW: 12.3 % (ref 11.5–15.5)
WBC: 7.8 10*3/uL (ref 4.0–10.5)
nRBC: 0 % (ref 0.0–0.2)

## 2018-03-25 LAB — WET PREP, GENITAL
Sperm: NONE SEEN
Trich, Wet Prep: NONE SEEN
Yeast Wet Prep HPF POC: NONE SEEN

## 2018-03-25 LAB — URINALYSIS, ROUTINE W REFLEX MICROSCOPIC
Bilirubin Urine: NEGATIVE
Glucose, UA: NEGATIVE mg/dL
Hgb urine dipstick: NEGATIVE
Ketones, ur: NEGATIVE mg/dL
Leukocytes, UA: NEGATIVE
Nitrite: NEGATIVE
Protein, ur: NEGATIVE mg/dL
Specific Gravity, Urine: 1.027 (ref 1.005–1.030)
pH: 5 (ref 5.0–8.0)

## 2018-03-25 LAB — HCG, QUANTITATIVE, PREGNANCY: hCG, Beta Chain, Quant, S: 57 m[IU]/mL — ABNORMAL HIGH (ref <5)

## 2018-03-25 LAB — POCT PREGNANCY, URINE: Preg Test, Ur: POSITIVE — AB

## 2018-03-25 MED ORDER — PREPLUS 27-1 MG PO TABS: 1.0000 | ORAL_TABLET | Freq: Every day | ORAL | 13 refills | Status: DC

## 2018-03-25 MED ORDER — METRONIDAZOLE 500 MG PO TABS: 500.0000 mg | ORAL_TABLET | Freq: Two times a day (BID) | ORAL | 0 refills | Status: DC

## 2018-03-25 NOTE — MAU Provider Note (Signed)
[facility] Attending MAU Note  Chief Complaint: Abdominal Pain and Possible Pregnancy  First Provider Initiated Contact with Patient 03/25/18 1108     SUBJECTIVE Angel Daugherty is a 22 y.o. pregnant G3P0020 at unknown gestation who presents with lower abdominal pain and back pain for the past week.  Found out she was pregnant about a week ago, reported crampy lower abdominal pain radiating to her back on both sides. Denies any abnormal vaginal bleeding, discharge, fevers, chills, sweats, dysuria, nausea, vomiting, other GI or GU symptoms or other general symptoms. History of SAB x 2. Not on prenatal vitamins yet, desires prescription.   Past Medical History:  Diagnosis Date  . Asthma   . Genital herpes 2019   pt. states shes never had an outbreak, and is unsure of dx.    OB History  Gravida Para Term Preterm AB Living  3       2 0  SAB TAB Ectopic Multiple Live Births  2            # Outcome Date GA Lbr Len/2nd Weight Sex Delivery Anes PTL Lv  3 Current           2 SAB           1 SAB            Past Surgical History:  Procedure Laterality Date  . CYST REMOVAL LEG    . PILONIDAL CYST EXCISION  August 2016  . PILONIDAL CYST EXCISION N/A 05/18/2015   Procedure: REXCISION OF PILONIDAL CYST ;  Surgeon: Glenna Fellows, MD;  Location: [facility];  Service: General;  Laterality: N/A;   Social History   Socioeconomic History  . Marital status: Single    Spouse name: Not on file  . Number of children: Not on file  . Years of education: Not on file  . Highest education level: Not on file  Occupational History  . Not on file  Social Needs  . Financial resource strain: Not on file  . Food insecurity:    Worry: Not on file    Inability: Not on file  . Transportation needs:    Medical: Not on file    Non-medical: Not on file  Tobacco Use  . Smoking status: Passive Smoke Exposure - Never Smoker  . Smokeless tobacco: Never Used  Substance and Sexual Activity  .  Alcohol use: No  . Drug use: No  . Sexual activity: Yes    Birth control/protection: None  Lifestyle  . Physical activity:    Days per week: Not on file    Minutes per session: Not on file  . Stress: Not on file  Relationships  . Social connections:    Talks on phone: Not on file    Gets together: Not on file    Attends religious service: Not on file    Active member of club or organization: Not on file    Attends meetings of clubs or organizations: Not on file    Relationship status: Not on file  . Intimate partner violence:    Fear of current or ex partner: Not on file    Emotionally abused: Not on file    Physically abused: Not on file    Forced sexual activity: Not on file  Other Topics Concern  . Not on file  Social History Narrative  . Not on file   No current facility-administered medications on file prior to encounter.    Current Outpatient Medications  on File Prior to Encounter  Medication Sig Dispense Refill  . albuterol (PROVENTIL HFA;VENTOLIN HFA) 108 (90 Base) MCG/ACT inhaler Inhale 2 puffs into the lungs every 2 (two) hours as needed for wheezing or shortness of breath.    . loratadine (CLARITIN) 10 MG tablet Take 10 mg by mouth daily as needed for allergies.    . Multiple Vitamin (MULTIVITAMIN WITH MINERALS) TABS tablet Take 1 tablet by mouth daily.    . ondansetron (ZOFRAN ODT) 4 MG disintegrating tablet Take 1 tablet (4 mg total) by mouth every 8 (eight) hours as needed for nausea or vomiting. 20 tablet 0  . vitamin B-12 (CYANOCOBALAMIN) 500 MCG tablet Take 500 mcg by mouth daily.     Allergies  Allergen Reactions  . Hydrocodone Itching    ROS: Pertinent items in HPI  OBJECTIVE BP 129/70 (BP Location: Right Arm)   Pulse 84   Temp 98.3 F (36.8 C) (Oral)   Resp 16   Ht 5\' 3"  (1.6 m)   Wt 82.3 kg   LMP 02/12/2018 (Exact Date)   SpO2 100%   BMI 32.15 kg/m  CONSTITUTIONAL: Well-developed, well-nourished female in no acute distress.  HENT:   Normocephalic, atraumatic, External right and left ear normal. Oropharynx is clear and moist EYES: Conjunctivae and EOM are normal. Pupils are equal, round, and reactive to light. No scleral icterus.  NECK: Normal range of motion, supple, no masses.  Normal thyroid.  SKIN: Skin is warm and dry. No rash noted. Not diaphoretic. No erythema. No pallor. NEUROLGIC: Alert and oriented to person, place, and time. Normal reflexes, muscle tone coordination. No cranial nerve deficit noted. PSYCHIATRIC: Normal mood and affect. Normal behavior. Normal judgment and thought content. CARDIOVASCULAR: Normal heart rate noted RESPIRATORY: Effort and breath sounds normal, no problems with respiration noted. ABDOMEN: Soft, normal bowel sounds, no distention noted.  No tenderness, rebound or guarding.  PELVIC: Normal appearing external genitalia; normal appearing vaginal mucosa and cervix.  White discharge, wet prep and pelvic cultures obtained.  Normal uterine size, no other palpable masses, no uterine or adnexal tenderness. MUSCULOSKELETAL: Normal range of motion. No tenderness.  No cyanosis, clubbing, or edema.  2+ distal pulses.  LAB RESULTS Results for orders placed or performed during the hospital encounter of 03/25/18 (from the past 48 hour(s))  Urinalysis, Routine w reflex microscopic     Status: Abnormal   Collection Time: 03/25/18 10:35 AM  Result Value Ref Range   Color, Urine YELLOW YELLOW   APPearance CLOUDY (A) CLEAR   Specific Gravity, Urine 1.027 1.005 - 1.030   pH 5.0 5.0 - 8.0   Glucose, UA NEGATIVE NEGATIVE mg/dL   Hgb urine dipstick NEGATIVE NEGATIVE   Bilirubin Urine NEGATIVE NEGATIVE   Ketones, ur NEGATIVE NEGATIVE mg/dL   Protein, ur NEGATIVE NEGATIVE mg/dL   Nitrite NEGATIVE NEGATIVE   Leukocytes, UA NEGATIVE NEGATIVE    Comment: Performed at [facility], [address] [place], [place] [address]  Pregnancy, urine POC     Status: Abnormal   Collection Time: 03/25/18 10:39 AM   Result Value Ref Range   Preg Test, Ur POSITIVE (A) NEGATIVE    Comment:        THE SENSITIVITY OF THIS METHODOLOGY IS >24 mIU/mL   hCG, quantitative, pregnancy     Status: Abnormal   Collection Time: 03/25/18 11:09 AM  Result Value Ref Range   hCG, Beta Chain, Quant, S 57 (H) <5 mIU/mL    Comment:  GEST. AGE      CONC.  (mIU/mL)   <=1 WEEK        5 - 50     2 WEEKS       50 - 500     3 WEEKS       100 - 10,000     4 WEEKS     1,000 - 30,000     5 WEEKS     3,500 - 115,000   6-8 WEEKS     12,000 - 270,000    12 WEEKS     15,000 - 220,000        FEMALE AND NON-PREGNANT FEMALE:     LESS THAN 5 mIU/mL Performed at [facility], [address] [place], [place] [address]   CBC with Differential/Platelet     Status: None   Collection Time: 03/25/18 11:11 AM  Result Value Ref Range   WBC 7.8 4.0 - 10.5 K/uL   RBC 4.21 3.87 - 5.11 MIL/uL   Hemoglobin 12.6 12.0 - 15.0 g/dL   HCT 19.1 47.8 - 29.5 %   MCV 86.2 80.0 - 100.0 fL   MCH 29.9 26.0 - 34.0 pg   MCHC 34.7 30.0 - 36.0 g/dL   RDW 62.1 30.8 - 65.7 %   Platelets 309 150 - 400 K/uL   nRBC 0.0 0.0 - 0.2 %   Neutrophils Relative % 73 %   Neutro Abs 5.7 1.7 - 7.7 K/uL   Lymphocytes Relative 23 %   Lymphs Abs 1.8 0.7 - 4.0 K/uL   Monocytes Relative 4 %   Monocytes Absolute 0.3 0.1 - 1.0 K/uL   Eosinophils Relative 0 %   Eosinophils Absolute 0.0 0.0 - 0.5 K/uL   Basophils Relative 0 %   Basophils Absolute 0.0 0.0 - 0.1 K/uL    Comment: Performed at [facility], [address] [place], [place] [address]  Wet prep, genital     Status: Abnormal   Collection Time: 03/25/18 11:19 AM  Result Value Ref Range   Yeast Wet Prep HPF POC NONE SEEN NONE SEEN    Comment: Specimen diluted due to transport tube containing more than 1 ml of saline, interpret results with caution.   Trich, Wet Prep NONE SEEN NONE SEEN   Clue Cells Wet Prep HPF POC PRESENT (A) NONE SEEN   WBC, Wet Prep HPF POC FEW (A) NONE SEEN    Comment:  MANY BACTERIA SEEN   Sperm NONE SEEN     Comment: Performed at [facility], [address], [place], [place] [address]    IMAGING Preliminary: No IUGS, no YS, no fetal pole, +corpus luteum cyst   MAU COURSE Labs and cultures done  ASSESSMENT 1. Pelvic pain affecting pregnancy, antepartum   2. Pelvic pain affecting pregnancy   3. BV (bacterial vaginosis)     PLAN Very early pregnancy Return in 48 hours for repeat HCG Ectopic precautions reviewed Metronidazole prescribed for BV Prenatal vitamins prescribed. Discharge home  Follow-up Information    [facility]. Go on 03/27/2018.   Why:  Follow up labs (HCG) at 11 am Contact information: [address] [id number] mc [place] [address] [phone]         Allergies as of 03/25/2018      Reactions   Hydrocodone Itching      Medication List    STOP taking these medications   multivitamin with minerals Tabs tablet   ondansetron 4 MG disintegrating tablet Commonly  known as:  ZOFRAN-ODT     TAKE these medications   albuterol 108 (90 Base) MCG/ACT inhaler Commonly known as:  PROVENTIL HFA;VENTOLIN HFA Inhale 2 puffs into the lungs every 2 (two) hours as needed for wheezing or shortness of breath.   loratadine 10 MG tablet Commonly known as:  CLARITIN Take 10 mg by mouth daily as needed for allergies.   metroNIDAZOLE 500 MG tablet Commonly known as:  FLAGYL Take 1 tablet (500 mg total) by mouth 2 (two) times daily.   PREPLUS 27-1 MG Tabs Take 1 tablet by mouth daily.   vitamin B-12 500 MCG tablet Commonly known as:  CYANOCOBALAMIN Take 500 mcg by mouth daily.        Tereso Newcomer, MD 03/25/2018 12:38 PM

## 2018-03-25 NOTE — MAU Note (Signed)
Found out preg last wk.  Has been having a lot of pain in abd and back. Has had 2 prior miscarriages.

## 2018-03-25 NOTE — Discharge Instructions (Signed)

## 2018-03-26 LAB — GC/CHLAMYDIA PROBE AMP (~~LOC~~) NOT AT ARMC
Chlamydia: NEGATIVE
Neisseria Gonorrhea: NEGATIVE

## 2018-03-31 ENCOUNTER — Other Ambulatory Visit: Payer: Medicaid Other

## 2018-03-31 DIAGNOSIS — O3680X0 Pregnancy with inconclusive fetal viability, not applicable or unspecified: Secondary | ICD-10-CM

## 2018-03-31 LAB — HCG, QUANTITATIVE, PREGNANCY: hCG, Beta Chain, Quant, S: 509 m[IU]/mL — ABNORMAL HIGH (ref <5)

## 2018-03-31 NOTE — Progress Notes (Signed)
Here for stat bhcg. Denies pain. Denies bleeding. Explained we will draw stat bhcg and have her wait in lobby for results which we will then discuss with provider and then her. She voices understanding.

## 2018-03-31 NOTE — Progress Notes (Signed)
Notified Dr. Jolayne Panther pt's beta results.  Providers recommendation to have an [place] in 10 days.  OB US scheduled October 25th @ 0800.  Pt notified of [place] appt. Pt verbalized understanding with no further questions.

## 2018-03-31 NOTE — Progress Notes (Signed)
I have reviewed the chart and agree with nursing staff's documentation of this patient's encounter.  Catalina Antigua, MD 03/31/2018 3:35 PM

## 2018-04-10 ENCOUNTER — Ambulatory Visit (HOSPITAL_COMMUNITY)
Admission: RE | Admit: 2018-04-10 | Discharge: 2018-04-10 | Disposition: A | Discharge status: Home / Self Care | Payer: Medicaid Other | Source: Ambulatory Visit | Attending: Obstetrics and Gynecology | Admitting: Obstetrics and Gynecology

## 2018-04-10 DIAGNOSIS — O3680X0 Pregnancy with inconclusive fetal viability, not applicable or unspecified: Secondary | ICD-10-CM

## 2018-04-10 DIAGNOSIS — Z3A00 Weeks of gestation of pregnancy not specified: Secondary | ICD-10-CM | POA: Diagnosis not present

## 2018-05-05 LAB — OB RESULTS CONSOLE HEPATITIS B SURFACE ANTIGEN: Hepatitis B Surface Ag: NEGATIVE

## 2018-05-05 LAB — OB RESULTS CONSOLE HIV ANTIBODY (ROUTINE TESTING): HIV: NONREACTIVE

## 2018-05-05 LAB — OB RESULTS CONSOLE ABO/RH: RH Type: POSITIVE

## 2018-05-05 LAB — OB RESULTS CONSOLE ANTIBODY SCREEN: Antibody Screen: NEGATIVE

## 2018-05-05 LAB — OB RESULTS CONSOLE GC/CHLAMYDIA
Chlamydia: NEGATIVE
Gonorrhea: NEGATIVE

## 2018-05-05 LAB — OB RESULTS CONSOLE RPR: RPR: NONREACTIVE

## 2018-05-05 LAB — OB RESULTS CONSOLE RUBELLA ANTIBODY, IGM: Rubella: IMMUNE

## 2018-06-17 NOTE — L&D Delivery Note (Signed)
Operative Delivery Note During delivery pt had temperature spike to 100.9  Treated with Ampicillin and tyenol - likely triple I.  At 9:34 AM a viable and healthy female was delivered via Vaginal, Spontaneous.  Presentation: vertex; Position: Left,, Occiput,, Anterior; Station: +2/+3.  Verbal consent: obtained from patient.  Risks and benefits discussed in detail.  Risks include, but are not limited to the risks of anesthesia, bleeding, infection, damage to maternal tissues, fetal cephalhematoma.  There is also the risk of inability to effect vaginal delivery of the head, or shoulder dystocia that cannot be resolved by established maneuvers, leading to the need for emergency cesarean section.  APGAR: 7, 9; weight  P.   Placenta status: delivered, intact .   Cord: 3V  with the following complications: double nuchal.    Anesthesia: epidural  Instruments: Kiwi Episiotomy: None Lacerations: R anterior labial laceration/periurethral and periclitoral  Suture Repair: 3.0 vicryl rapide Est. Blood Loss (mL):    Mom to postpartum.  Baby to Couplet care / Skin to Skin.  Angel Daugherty 12/01/2018, 9:59 AM  Br/O+/Tdap in PNC/RI/Contra?  Desires circumcision for female infant.  D/w pt r/b/a of procedure, wish to proceed in office

## 2018-11-23 LAB — OB RESULTS CONSOLE GBS: GBS: NEGATIVE

## 2018-11-26 ENCOUNTER — Telehealth (HOSPITAL_COMMUNITY): Payer: Self-pay | Admitting: *Deleted

## 2018-11-26 ENCOUNTER — Encounter (HOSPITAL_COMMUNITY): Payer: Self-pay | Admitting: *Deleted

## 2018-11-26 NOTE — Telephone Encounter (Signed)
Preadmission screen  

## 2018-11-27 ENCOUNTER — Other Ambulatory Visit (HOSPITAL_COMMUNITY)
Admission: RE | Admit: 2018-11-27 | Discharge: 2018-11-27 | Disposition: A | Discharge status: Home / Self Care | Payer: Medicaid Other | Source: Ambulatory Visit | Attending: Obstetrics and Gynecology | Admitting: Obstetrics and Gynecology

## 2018-11-27 ENCOUNTER — Other Ambulatory Visit (HOSPITAL_COMMUNITY): Payer: Self-pay | Admitting: *Deleted

## 2018-11-27 ENCOUNTER — Other Ambulatory Visit: Payer: Self-pay

## 2018-11-27 DIAGNOSIS — Z01812 Encounter for preprocedural laboratory examination: Secondary | ICD-10-CM | POA: Insufficient documentation

## 2018-11-27 DIAGNOSIS — Z1159 Encounter for screening for other viral diseases: Secondary | ICD-10-CM | POA: Diagnosis not present

## 2018-11-27 NOTE — MAU Note (Signed)
Asymptomatic, swab collected without problem. 

## 2018-11-28 LAB — NOVEL CORONAVIRUS, NAA (HOSP ORDER, SEND-OUT TO REF LAB; TAT 18-24 HRS): SARS-CoV-2, NAA: NOT DETECTED

## 2018-11-29 NOTE — H&P (Signed)
Angel Daugherty is a 34 [id number] female presenting at 35 0/7 weeks for iol due to preE. Pt with increasing BP not in severe range but > 15 above baseline. PReE labs noted proteinuria>1gm. Pt reports malaise. She is dated per 5 week US> She had a negative first trimester screen and pregnancy has been uncomplicated till BP increasing in last week. She has a hisotry of recurrent pregnancy loss - was on baby asa. She is positive for hsv and has been pn prophylaxis since 36weeks. No lesions at last exam. GBS is negative OB History    Gravida  3   Para      Term      Preterm      AB  2   Living  0     SAB  2   TAB      Ectopic      Multiple      Live Births             Past Medical History:  Diagnosis Date  . Asthma   . Genital herpes 2019   pt. states shes never had an outbreak, and is unsure of dx.    Past Surgical History:  Procedure Laterality Date  . CYST REMOVAL LEG    . PILONIDAL CYST EXCISION  August 2016  . PILONIDAL CYST EXCISION N/A 05/18/2015   Procedure: REXCISION OF PILONIDAL CYST ;  Surgeon: Excell Seltzer, MD;  Location: [facility];  Service: General;  Laterality: N/A;   Family History: family history includes Arthritis in her mother; Heart disease in her mother; Hypertension in her mother; Kidney disease in her mother. Social History:  reports that she is a non-smoker but has been exposed to tobacco smoke. She has been exposed to tobacco smoke for the past 18.00 years. She has never used smokeless tobacco. She reports that she does not drink alcohol or use drugs.     Maternal Diabetes: No Genetic Screening: Normal Maternal Ultrasounds/Referrals: Normal Fetal Ultrasounds or other Referrals:  None Maternal Substance Abuse:  No Significant Maternal Medications:  None Significant Maternal Lab Results:  Lab values include: Group B Strep negative Other Comments:  None  Review of Systems  Constitutional: Negative for chills, fever, malaise/fatigue and  weight loss.  Eyes: Negative for blurred vision and double vision.  Respiratory: Negative for cough and hemoptysis.   Cardiovascular: Negative for chest pain.  Gastrointestinal: Negative for abdominal pain, heartburn, nausea and vomiting.  Genitourinary: Negative for dysuria.  Musculoskeletal: Negative for back pain, myalgias and neck pain.  Skin: Negative for itching and rash.  Neurological: Negative for dizziness and headaches.  Psychiatric/Behavioral: Negative for depression, hallucinations, substance abuse and suicidal ideas. The patient is not nervous/anxious.    Maternal Medical History:  Reason for admission: Nausea. iol  Fetal activity: Perceived fetal activity is normal.   Last perceived fetal movement was within the past hour.    Prenatal complications: Pre-eclampsia.   Prenatal Complications - Diabetes: none.      Last menstrual period 02/12/2018, unknown if currently breastfeeding. Maternal Exam:  Uterine Assessment: Contraction frequency is rare.   Abdomen: Patient reports generalized tenderness.  Estimated fetal weight is AGA.   Fetal presentation: vertex  Introitus: Normal vulva. Vulva is negative for condylomata and lesion.  Normal vagina.  Vagina is negative for condylomata.  Pelvis: adequate for delivery.   Cervix: Cervix evaluated by digital exam.     Physical Exam  Constitutional: She is oriented to person, place, and time. She  appears well-developed and well-nourished.  Neck: Normal range of motion.  Cardiovascular: Normal rate.  Respiratory: Effort normal and breath sounds normal.  GI: There is generalized abdominal tenderness.  Genitourinary:    Vulva, vagina and uterus normal.     No vulval condylomata or lesion noted.   Musculoskeletal: Normal range of motion.  Neurological: She is alert and oriented to person, place, and time.  Skin: Skin is warm.  Psychiatric: She has a normal mood and affect. Her behavior is normal. Judgment and thought  content normal.    Prenatal labs: ABO, Rh: O/Positive/-- (11/19 0000) Antibody: Negative (11/19 0000) Rubella: Immune (11/19 0000) RPR: Nonreactive (11/19 0000)  HBsAg: Negative (11/19 0000)  HIV: Non-reactive (11/19 0000)  GBS: Negative (06/08 0000)   Assessment/Plan: 41JW [name] at 39 0/7 weeks with preE for iol Admit for cervical ripening then AROM/PIt as needed Pain control prn Check preE labs Mange BP as needed Anticipate svd   Janean SarkCecilia W Llana Daugherty 11/29/2018, 2:17 PM

## 2018-11-30 ENCOUNTER — Inpatient Hospital Stay (HOSPITAL_COMMUNITY)
Admission: AD | Admit: 2018-11-30 | Discharge: 2018-12-03 | DRG: 805 | Disposition: A | Discharge status: Home / Self Care | Payer: Medicaid Other | Attending: Obstetrics and Gynecology | Admitting: Obstetrics and Gynecology

## 2018-11-30 ENCOUNTER — Inpatient Hospital Stay (HOSPITAL_COMMUNITY): Payer: Medicaid Other

## 2018-11-30 ENCOUNTER — Other Ambulatory Visit: Payer: Self-pay

## 2018-11-30 ENCOUNTER — Inpatient Hospital Stay (HOSPITAL_COMMUNITY): Payer: Medicaid Other | Admitting: Anesthesiology

## 2018-11-30 ENCOUNTER — Encounter (HOSPITAL_COMMUNITY): Payer: Self-pay

## 2018-11-30 DIAGNOSIS — O411230 Chorioamnionitis, third trimester, not applicable or unspecified: Secondary | ICD-10-CM | POA: Diagnosis present

## 2018-11-30 DIAGNOSIS — O1494 Unspecified pre-eclampsia, complicating childbirth: Principal | ICD-10-CM | POA: Diagnosis present

## 2018-11-30 DIAGNOSIS — A6000 Herpesviral infection of urogenital system, unspecified: Secondary | ICD-10-CM | POA: Diagnosis present

## 2018-11-30 DIAGNOSIS — O9832 Other infections with a predominantly sexual mode of transmission complicating childbirth: Secondary | ICD-10-CM | POA: Diagnosis present

## 2018-11-30 DIAGNOSIS — Z3A39 39 weeks gestation of pregnancy: Secondary | ICD-10-CM | POA: Diagnosis not present

## 2018-11-30 DIAGNOSIS — O480 Post-term pregnancy: Secondary | ICD-10-CM | POA: Diagnosis present

## 2018-11-30 DIAGNOSIS — O1493 Unspecified pre-eclampsia, third trimester: Secondary | ICD-10-CM | POA: Diagnosis present

## 2018-11-30 LAB — CBC
HCT: 35.4 % — ABNORMAL LOW (ref 36.0–46.0)
HCT: 34.3 % — ABNORMAL LOW (ref 36.0–46.0)
Hemoglobin: 11.9 g/dL — ABNORMAL LOW (ref 12.0–15.0)
Hemoglobin: 11.5 g/dL — ABNORMAL LOW (ref 12.0–15.0)
MCH: 29 pg (ref 26.0–34.0)
MCH: 28.7 pg (ref 26.0–34.0)
MCHC: 33.6 g/dL (ref 30.0–36.0)
MCHC: 33.5 g/dL (ref 30.0–36.0)
MCV: 86.1 fL (ref 80.0–100.0)
MCV: 85.5 fL (ref 80.0–100.0)
Platelets: 268 10*3/uL (ref 150–400)
Platelets: 253 10*3/uL (ref 150–400)
RBC: 4.11 MIL/uL (ref 3.87–5.11)
RBC: 4.01 MIL/uL (ref 3.87–5.11)
RDW: 14.4 % (ref 11.5–15.5)
RDW: 14.2 % (ref 11.5–15.5)
WBC: 10.4 10*3/uL (ref 4.0–10.5)
WBC: 13.8 10*3/uL — ABNORMAL HIGH (ref 4.0–10.5)
nRBC: 0 % (ref 0.0–0.2)
nRBC: 0 % (ref 0.0–0.2)

## 2018-11-30 LAB — HEPATIC FUNCTION PANEL
ALT: 15 U/L (ref 0–44)
AST: 32 U/L (ref 15–41)
Albumin: 2.9 g/dL — ABNORMAL LOW (ref 3.5–5.0)
Alkaline Phosphatase: 146 U/L — ABNORMAL HIGH (ref 38–126)
Bilirubin, Direct: <0.1 mg/dL (ref 0.0–0.2)
Total Bilirubin: 0.4 mg/dL (ref 0.3–1.2)
Total Protein: 6.9 g/dL (ref 6.5–8.1)

## 2018-11-30 LAB — TYPE AND SCREEN
ABO/RH(D): O POS
Antibody Screen: NEGATIVE

## 2018-11-30 LAB — LACTATE DEHYDROGENASE: LDH: 127 U/L (ref 98–192)

## 2018-11-30 LAB — URIC ACID: Uric Acid, Serum: 4.8 mg/dL (ref 2.5–7.1)

## 2018-11-30 MED ORDER — TERBUTALINE SULFATE 1 MG/ML IJ SOLN: 0.2500 mg | Freq: Once | INTRAMUSCULAR | Status: DC | PRN

## 2018-11-30 MED ORDER — LACTATED RINGERS IV SOLN
500.0000 mL | Freq: Once | INTRAVENOUS | Status: AC
  Administered 2018-11-30: 500 mL via INTRAVENOUS

## 2018-11-30 MED ORDER — DIPHENHYDRAMINE HCL 50 MG/ML IJ SOLN: 12.5000 mg | INTRAMUSCULAR | Status: DC | PRN

## 2018-11-30 MED ORDER — ONDANSETRON HCL 4 MG/2ML IJ SOLN: 4.0000 mg | Freq: Four times a day (QID) | INTRAMUSCULAR | Status: DC | PRN

## 2018-11-30 MED ORDER — MISOPROSTOL 50MCG HALF TABLET
ORAL_TABLET | ORAL | Status: AC
  Filled 2018-11-30: qty 1

## 2018-11-30 MED ORDER — EPHEDRINE 5 MG/ML INJ: 10.0000 mg | INTRAVENOUS | Status: DC | PRN

## 2018-11-30 MED ORDER — LACTATED RINGERS IV SOLN
INTRAVENOUS | Status: DC
  Administered 2018-11-30 – 2018-12-01 (×3): via INTRAVENOUS

## 2018-11-30 MED ORDER — OXYTOCIN BOLUS FROM INFUSION
500.0000 mL | Freq: Once | INTRAVENOUS | Status: AC
  Administered 2018-12-01: 500 mL via INTRAVENOUS

## 2018-11-30 MED ORDER — MISOPROSTOL 25 MCG QUARTER TABLET
25.0000 ug | ORAL_TABLET | ORAL | Status: DC | PRN
  Administered 2018-11-30 (×2): 25 ug via VAGINAL
  Filled 2018-11-30 (×2): qty 1

## 2018-11-30 MED ORDER — MISOPROSTOL 50MCG HALF TABLET
50.0000 ug | ORAL_TABLET | ORAL | Status: DC
  Administered 2018-11-30: 50 ug via BUCCAL

## 2018-11-30 MED ORDER — LIDOCAINE HCL (PF) 1 % IJ SOLN
INTRAMUSCULAR | Status: DC | PRN
  Administered 2018-11-30: 10 mL via EPIDURAL

## 2018-11-30 MED ORDER — LIDOCAINE HCL (PF) 1 % IJ SOLN
30.0000 mL | INTRAMUSCULAR | Status: DC | PRN
  Filled 2018-11-30: qty 30

## 2018-11-30 MED ORDER — PHENYLEPHRINE 40 MCG/ML (10ML) SYRINGE FOR IV PUSH (FOR BLOOD PRESSURE SUPPORT): 80.0000 ug | PREFILLED_SYRINGE | INTRAVENOUS | Status: DC | PRN

## 2018-11-30 MED ORDER — SODIUM CHLORIDE (PF) 0.9 % IJ SOLN
INTRAMUSCULAR | Status: DC | PRN
  Administered 2018-11-30: 14 mL/h via EPIDURAL

## 2018-11-30 MED ORDER — PHENYLEPHRINE 40 MCG/ML (10ML) SYRINGE FOR IV PUSH (FOR BLOOD PRESSURE SUPPORT)
80.0000 ug | PREFILLED_SYRINGE | INTRAVENOUS | Status: DC | PRN
  Filled 2018-11-30: qty 10

## 2018-11-30 MED ORDER — ACETAMINOPHEN 325 MG PO TABS: 650.0000 mg | ORAL_TABLET | ORAL | Status: DC | PRN

## 2018-11-30 MED ORDER — OXYTOCIN 40 UNITS IN NORMAL SALINE INFUSION - SIMPLE MED
2.5000 [IU]/h | INTRAVENOUS | Status: DC
  Filled 2018-11-30: qty 1000

## 2018-11-30 MED ORDER — LACTATED RINGERS IV SOLN: 500.0000 mL | INTRAVENOUS | Status: DC | PRN

## 2018-11-30 MED ORDER — FENTANYL CITRATE (PF) 100 MCG/2ML IJ SOLN: 50.0000 ug | INTRAMUSCULAR | Status: DC | PRN

## 2018-11-30 MED ORDER — OXYTOCIN 40 UNITS IN NORMAL SALINE INFUSION - SIMPLE MED
1.0000 m[IU]/min | INTRAVENOUS | Status: DC
  Administered 2018-11-30: 2 m[IU]/min via INTRAVENOUS
  Administered 2018-12-01: 10 m[IU]/min via INTRAVENOUS

## 2018-11-30 MED ORDER — FENTANYL-BUPIVACAINE-NACL 0.5-0.125-0.9 MG/250ML-% EP SOLN
12.0000 mL/h | EPIDURAL | Status: DC | PRN
  Filled 2018-11-30: qty 250

## 2018-11-30 MED ORDER — SOD CITRATE-CITRIC ACID 500-334 MG/5ML PO SOLN: 30.0000 mL | ORAL | Status: DC | PRN

## 2018-11-30 NOTE — Progress Notes (Signed)
Patient ID: Angel Daugherty, female   DOB: March 16, 1996, 23 y.o.   MRN: [id number] Pt comfortable with epidural. No complaints. +FMs VS - 129/73 CAt 1, 120s Irreg contractions q 1-89mins SVE - 5/80/-2, mid position  A/P: G3P0020 at 39+ weeks          IUPC placed; FSE placed by nurse earlier         Titrate pitocin per protocol till adequate mvus - now at 25mus          Place on side with peanut ball

## 2018-11-30 NOTE — Anesthesia Procedure Notes (Signed)
Epidural Patient location during procedure: OB Start time: 11/30/2018 8:22 PM End time: 11/30/2018 8:35 PM  Staffing Anesthesiologist: Lidia Collum, MD Performed: anesthesiologist   Preanesthetic Checklist Completed: patient identified, pre-op evaluation, timeout performed, IV checked, risks and benefits discussed and monitors and equipment checked  Epidural Patient position: sitting Prep: DuraPrep Patient monitoring: heart rate, continuous pulse ox and blood pressure Approach: midline Location: L3-L4 Injection technique: LOR air  Needle:  Needle type: Tuohy  Needle gauge: 17 G Needle length: 9 cm Needle insertion depth: 6 cm Catheter type: closed end flexible Catheter size: 19 Gauge Catheter at skin depth: 11 cm Test dose: negative  Assessment Events: blood not aspirated, injection not painful, no injection resistance, negative IV test and no paresthesia  Additional Notes Reason for block:procedure for pain

## 2018-11-30 NOTE — Progress Notes (Signed)
Patient ID: Angel Daugherty, female   DOB: [birth year], 23 y.o.   MRN: [id number] Pt with no complaints; not appreciating contractions yet. +FMs No HA or blurry vision VS- 136/74 EFM - cat1, 120 TOCO - irritability and rare contraction SVE - 1/70/-3  A/P: [id number] at 64 1/[redacted]wks gestation for iol 2/2 preE - s/p cytotec x 2         BP stable         Cooks catheter placed with 60/34ml fluid         Third dose of cytotec now; will give orally

## 2018-11-30 NOTE — Progress Notes (Signed)
Patient ID: Angel Daugherty, female   DOB: 1996/05/25, 23 y.o.   MRN: [id number] Pt doing well with no complaints. Still not fully appreciating all contractions with pitocin. Cooks catheter came out at 315pm. +FMs, no HA or blurry vision VS - 126/78 CAT 1, 120 TOCO - contractions q 1-3 mins SVE - 4.5/80/-2, posterior  A/P: Prime at 39+, s/p cytotec x 3 and foley, now on pitocin at 79mus         AROM performed with clear fluid; copious noted         Expectant mgmt

## 2018-11-30 NOTE — Anesthesia Preprocedure Evaluation (Signed)
Anesthesia Evaluation  Patient identified by MRN, date of birth, ID band Patient awake    Reviewed: Allergy & Precautions, H&P , NPO status , Patient's Chart, lab work & pertinent test results  History of Anesthesia Complications Negative for: history of anesthetic complications  Airway Mallampati: II  TM Distance: >3 FB Neck ROM: full    Dental no notable dental hx.    Pulmonary neg pulmonary ROS,    Pulmonary exam normal        Cardiovascular hypertension, Normal cardiovascular exam Rhythm:regular Rate:Normal     Neuro/Psych negative neurological ROS  negative psych ROS   GI/Hepatic negative GI ROS, Neg liver ROS,   Endo/Other  negative endocrine ROS  Renal/GU negative Renal ROS  negative genitourinary   Musculoskeletal   Abdominal   Peds  Hematology negative hematology ROS (+)   Anesthesia Other Findings Pre-eclampsia, recent platelet count 253  Reproductive/Obstetrics (+) Pregnancy                             Anesthesia Physical Anesthesia Plan  ASA: III  Anesthesia Plan: Epidural   Post-op Pain Management:    Induction:   PONV Risk Score and Plan:   Airway Management Planned:   Additional Equipment:   Intra-op Plan:   Post-operative Plan:   Informed Consent: I have reviewed the patients History and Physical, chart, labs and discussed the procedure including the risks, benefits and alternatives for the proposed anesthesia with the patient or authorized representative who has indicated his/her understanding and acceptance.       Plan Discussed with:   Anesthesia Plan Comments:         Anesthesia Quick Evaluation

## 2018-12-01 ENCOUNTER — Encounter (HOSPITAL_COMMUNITY): Payer: Self-pay | Admitting: Obstetrics and Gynecology

## 2018-12-01 LAB — RPR: RPR Ser Ql: NONREACTIVE

## 2018-12-01 LAB — CBC
HCT: 33.4 % — ABNORMAL LOW (ref 36.0–46.0)
Hemoglobin: 11.2 g/dL — ABNORMAL LOW (ref 12.0–15.0)
MCH: 29.2 pg (ref 26.0–34.0)
MCHC: 33.5 g/dL (ref 30.0–36.0)
MCV: 87.2 fL (ref 80.0–100.0)
Platelets: 240 10*3/uL (ref 150–400)
RBC: 3.83 MIL/uL — ABNORMAL LOW (ref 3.87–5.11)
RDW: 14.5 % (ref 11.5–15.5)
WBC: 19.2 10*3/uL — ABNORMAL HIGH (ref 4.0–10.5)
nRBC: 0 % (ref 0.0–0.2)

## 2018-12-01 MED ORDER — OXYCODONE HCL 5 MG PO TABS: 10.0000 mg | ORAL_TABLET | ORAL | Status: DC | PRN

## 2018-12-01 MED ORDER — OXYCODONE HCL 5 MG PO TABS: 5.0000 mg | ORAL_TABLET | ORAL | Status: DC | PRN

## 2018-12-01 MED ORDER — TETANUS-DIPHTH-ACELL PERTUSSIS 5-2.5-18.5 LF-MCG/0.5 IM SUSP: 0.5000 mL | Freq: Once | INTRAMUSCULAR | Status: DC

## 2018-12-01 MED ORDER — ONDANSETRON HCL 4 MG PO TABS: 4.0000 mg | ORAL_TABLET | ORAL | Status: DC | PRN

## 2018-12-01 MED ORDER — SODIUM CHLORIDE 0.9 % IV SOLN
2.0000 g | Freq: Once | INTRAVENOUS | Status: AC
  Administered 2018-12-01: 2 g via INTRAVENOUS
  Filled 2018-12-01: qty 2000

## 2018-12-01 MED ORDER — ACETAMINOPHEN 500 MG PO TABS
1000.0000 mg | ORAL_TABLET | Freq: Four times a day (QID) | ORAL | Status: DC | PRN
  Administered 2018-12-01: 1000 mg via ORAL
  Filled 2018-12-01: qty 2

## 2018-12-01 MED ORDER — IBUPROFEN 600 MG PO TABS
600.0000 mg | ORAL_TABLET | Freq: Four times a day (QID) | ORAL | Status: DC
  Administered 2018-12-01 – 2018-12-03 (×9): 600 mg via ORAL
  Filled 2018-12-01 (×9): qty 1

## 2018-12-01 MED ORDER — COCONUT OIL OIL
1.0000 "application " | TOPICAL_OIL | Status: DC | PRN
  Administered 2018-12-02: 1 via TOPICAL

## 2018-12-01 MED ORDER — PRENATAL MULTIVITAMIN CH
1.0000 | ORAL_TABLET | Freq: Every day | ORAL | Status: DC
  Administered 2018-12-01 – 2018-12-03 (×3): 1 via ORAL
  Filled 2018-12-01 (×3): qty 1

## 2018-12-01 MED ORDER — BENZOCAINE-MENTHOL 20-0.5 % EX AERO
1.0000 "application " | INHALATION_SPRAY | CUTANEOUS | Status: DC | PRN
  Administered 2018-12-01: 1 via TOPICAL
  Filled 2018-12-01: qty 56

## 2018-12-01 MED ORDER — ZOLPIDEM TARTRATE 5 MG PO TABS: 5.0000 mg | ORAL_TABLET | Freq: Every evening | ORAL | Status: DC | PRN

## 2018-12-01 MED ORDER — DIPHENHYDRAMINE HCL 50 MG/ML IJ SOLN
25.0000 mg | Freq: Once | INTRAMUSCULAR | Status: AC
  Administered 2018-12-01: 25 mg via INTRAVENOUS
  Filled 2018-12-01: qty 1

## 2018-12-01 MED ORDER — LACTATED RINGERS IV SOLN: INTRAVENOUS | Status: DC

## 2018-12-01 MED ORDER — DIBUCAINE (PERIANAL) 1 % EX OINT: 1.0000 "application " | TOPICAL_OINTMENT | CUTANEOUS | Status: DC | PRN

## 2018-12-01 MED ORDER — SIMETHICONE 80 MG PO CHEW: 80.0000 mg | CHEWABLE_TABLET | ORAL | Status: DC | PRN

## 2018-12-01 MED ORDER — WITCH HAZEL-GLYCERIN EX PADS: 1.0000 "application " | MEDICATED_PAD | CUTANEOUS | Status: DC | PRN

## 2018-12-01 MED ORDER — ALBUTEROL SULFATE (2.5 MG/3ML) 0.083% IN NEBU: 3.0000 mL | INHALATION_SOLUTION | RESPIRATORY_TRACT | Status: DC | PRN

## 2018-12-01 MED ORDER — DIPHENHYDRAMINE HCL 25 MG PO CAPS: 25.0000 mg | ORAL_CAPSULE | Freq: Four times a day (QID) | ORAL | Status: DC | PRN

## 2018-12-01 MED ORDER — ACETAMINOPHEN 325 MG PO TABS
650.0000 mg | ORAL_TABLET | ORAL | Status: DC | PRN
  Administered 2018-12-01: 650 mg via ORAL
  Filled 2018-12-01: qty 2

## 2018-12-01 MED ORDER — ONDANSETRON HCL 4 MG/2ML IJ SOLN: 4.0000 mg | INTRAMUSCULAR | Status: DC | PRN

## 2018-12-01 MED ORDER — SENNOSIDES-DOCUSATE SODIUM 8.6-50 MG PO TABS
2.0000 | ORAL_TABLET | ORAL | Status: DC
  Administered 2018-12-02 (×2): 2 via ORAL
  Filled 2018-12-01 (×2): qty 2

## 2018-12-01 NOTE — Anesthesia Postprocedure Evaluation (Signed)
Anesthesia Post Note  Patient: Angel Daugherty  Procedure(s) Performed: AN AD [place]     Patient location during evaluation: Mother Baby Anesthesia Type: Epidural Level of consciousness: awake and alert Pain management: pain level controlled Vital Signs Assessment: post-procedure vital signs reviewed and stable Respiratory status: spontaneous breathing, nonlabored ventilation and respiratory function stable Cardiovascular status: stable Postop Assessment: no headache, no backache and epidural receding Anesthetic complications: no    Last Vitals:  Vitals:   12/01/18 1115 12/01/18 1215  BP: 122/61 131/75  Pulse: 82 88  Resp: 18 18  Temp: 36.8 C 37.4 C  SpO2:      Last Pain:  Vitals:   12/01/18 1545  TempSrc:   PainSc: 0-No pain   Pain Goal:                   Drucie Opitz

## 2018-12-01 NOTE — Lactation Note (Signed)
This note was copied from a baby's chart. Lactation Consultation Note  Patient Name: Angel Daugherty INOMV'E Date: 12/01/2018 Reason for consult: Initial assessment;Primapara;Term P1.  Newborn is 5 hours old and has been to the breast twice.  Breastfeeding basics reviewed.  Questions answered.  Instructed to breastfeed baby with any feeding cue and call for assist prn.  Breastfeeding consultation services and support information given and reviewed.  Maternal Data    Feeding Feeding Type: Breast Fed  LATCH Score Latch: Repeated attempts needed to sustain latch, nipple held in mouth throughout feeding, stimulation needed to elicit sucking reflex.  Audible Swallowing: A few with stimulation  Type of Nipple: Everted at rest and after stimulation  Comfort (Breast/Nipple): Soft / non-tender  Hold (Positioning): Assistance needed to correctly position infant at breast and maintain latch.  LATCH Score: 7  Interventions    Lactation Tools Discussed/Used     Consult Status Consult Status: Follow-up Date: 12/02/18 Follow-up type: In-patient    [name] 12/01/2018, 2:44 PM

## 2018-12-01 NOTE — Progress Notes (Signed)
Patient ID: Angel Daugherty, female   DOB: 06-04-1996, 23 y.o.   MRN: [id number] Pt still comfortable with epidural. She has uncontrollable shakes. VSS FHTs noted to still at baseline of 120 but with variable and late decels after pitocin increased from 18 to 66mus; cat 2 SVE changed from 5cm just before increase to 7/90/-2 Contractions in better pattern : q 2-59mins and now adequate at 220  Plan: pt placed in high fowlers and FHTs noted to improve          Will continue with expectant mgmt; decrease or stop pitocin prn

## 2018-12-01 NOTE — Progress Notes (Signed)
Patient ID: Angel Daugherty, female   DOB: Jun 21, 1995, 23 y.o.   MRN: [id number] Pt reports pressure in her buttocks VSS Cat 1 now but had cat 2 about an hour ago TOCO - ctxs q 2-27mins; adequate mvus , pit at 37mus SVE - mildly edematous lip on left half of cervix  = 8.5/90/0  A/P: G3P0020 progressing in labor - still on curve, IUPC and FSE in place         Continue with current mgmt; pitocin adequate         Will give 25mg  benadryl via iv now          Pt counseled to parameters for c/s

## 2018-12-01 NOTE — Progress Notes (Signed)
Patient ID: Angel Daugherty, female   DOB: Jan 08, 1996, 23 y.o.   MRN: [id number]   Pt comfortable with epidural  AFVSS gen NAD FHTs 130-140's, mod var, variables w pushing/early decels, category 1-2 toco q 65min  SVE 10/100/+2  Will continue pushing, expect SVD

## 2018-12-01 NOTE — Progress Notes (Signed)
Patient ID: Angel Daugherty, female   DOB: 04-29-1996, 23 y.o.   MRN: [id number] Pitocin was stopped due to persistent cat 2 strip.  After 49min pitocin was restarted at 54mus.  Pt just rechecked 2hrs later and is 8/90/-1 Continue with expectant mgmt

## 2018-12-02 LAB — CBC
HCT: 27.9 % — ABNORMAL LOW (ref 36.0–46.0)
Hemoglobin: 9.2 g/dL — ABNORMAL LOW (ref 12.0–15.0)
MCH: 28.7 pg (ref 26.0–34.0)
MCHC: 33 g/dL (ref 30.0–36.0)
MCV: 86.9 fL (ref 80.0–100.0)
Platelets: 199 10*3/uL (ref 150–400)
RBC: 3.21 MIL/uL — ABNORMAL LOW (ref 3.87–5.11)
RDW: 14.5 % (ref 11.5–15.5)
WBC: 14.9 10*3/uL — ABNORMAL HIGH (ref 4.0–10.5)
nRBC: 0 % (ref 0.0–0.2)

## 2018-12-02 NOTE — Progress Notes (Signed)
PPD #1 No problems Afeb, VSS Fundus firm, NT at U-1 Continue routine postpartum care 

## 2018-12-02 NOTE — Lactation Note (Signed)
This note was copied from a baby's chart. Lactation Consultation Note  Patient Name: Angel Daugherty [id number]'[id number] Date: 12/02/2018 Reason for consult: Follow-up assessment   Baby 42 hours old.  Mother states she has been hand expressing prior to latching. Provided mother w/ manual pump to prepump before latching since she has more difficulty latching on R side. Feed on demand approximately 8-12 times per day.   Reviewed waking techniques and cluster feeding.     Maternal Data    Feeding Feeding Type: Breast Fed  LATCH Score                   Interventions Interventions: Breast feeding basics reviewed;Hand pump  Lactation Tools Discussed/Used     Consult Status Consult Status: Follow-up Date: 12/03/18 Follow-up type: In-patient    Vivianne Master [facility] 12/02/2018, 11:39 AM

## 2018-12-03 MED ORDER — IBUPROFEN 600 MG PO TABS: 600.0000 mg | ORAL_TABLET | Freq: Four times a day (QID) | ORAL | 1 refills | Status: DC | PRN

## 2018-12-03 MED ORDER — PREPLUS 27-1 MG PO TABS: 1.0000 | ORAL_TABLET | Freq: Every day | ORAL | 5 refills | Status: DC

## 2018-12-03 NOTE — Progress Notes (Signed)
Post Partum Day 2 Subjective: no complaints, up ad lib, voiding, tolerating PO and nl lochia, pain controlled  Objective: Blood pressure 132/78, pulse 92, temperature 98.6 F (37 C), temperature source Oral, resp. rate 16, height 5\' 3"  (1.6 m), weight 96.3 kg, last menstrual period 02/12/2018, SpO2 100 %, unknown if currently breastfeeding.  Physical Exam:  General: alert and no distress Lochia: appropriate Uterine Fundus: firm   Recent Labs    12/01/18 1128 12/02/18 0539  HGB 11.2* 9.2*  HCT 33.4* 27.9*    Assessment/Plan: Discharge home, Breastfeeding and Lactation consult.  Routine PP care.  D/C with Motrin and PNV.  F/u 6 weeks.     LOS: 3 days   Angel Daugherty 12/03/2018, 8:32 AM

## 2018-12-03 NOTE — Lactation Note (Signed)
This note was copied from a baby's chart. Lactation Consultation Note  Patient Name: Boy Jenan Ellegood BMWUX'L Date: 12/03/2018   Mom latched infant & dimpling was immediately noted. I assisted Mom in getting an asymmetrical latch & " Lissa Merlin" nursed very well. Swallowing was immediately noted (swallows verified by cervical auscultation). Specifics of an asymmetric latch shown via [employer]. Mom was taught sound of swallows. Signs of contentment, early feeding cues, & breast management (eg use of breast compressions, etc). discussed.   Mom's nipples noted to have mild compression stripes. When infant released latch, nipple was not compressed.   Mom has smaller diameter nipples, so a size 21 flange was provided & lubricated with coconut oil for Mom to try.   A pacifier was noted in bassinet; pacifier use not recommended at this time.   Infant's tongue was noted not to be elevated when crying, but infant was going to breast an additional time, so was unable to do a complete oral exam. Mom does have initial tenderness, but it resolves within seconds of latching.  Matthias Hughs [facility] 12/03/2018, 9:22 AM

## 2018-12-03 NOTE — Discharge Summary (Signed)
OB Discharge Summary     Patient Name: Angel Daugherty DOB: 06-25-1995 MRN: [id number]  Date of admission: 11/30/2018 Delivering MD: Janyth Contes   Date of discharge: 12/03/2018  Admitting diagnosis: pregnancy Intrauterine pregnancy: [redacted]w[redacted]d     Secondary diagnosis:  Principal Problem:   Vacuum-assisted vaginal delivery Active Problems:   Preeclampsia, third trimester  Additional problems: N/A     Discharge diagnosis: Term Pregnancy Delivered                                                                                                Post partum procedures:N/A  Augmentation: AROM, Pitocin, Cytotec and Foley Balloon  Complications: None  Hospital course:  Induction of Labor With Vaginal Delivery   23 y.o. yo [id number] at [redacted]w[redacted]d was admitted to the hospital 11/30/2018 for induction of labor.  Indication for induction: Postdates.  Patient had an uncomplicated labor course as follows: Membrane Rupture Time/Date: 5:28 PM ,11/30/2018   Intrapartum Procedures: Episiotomy: None [1]                                         Lacerations:    Patient had delivery of a Viable infant.  Information for the patient's newborn:  Angel Daugherty [[id number]]  Delivery Method: Vaginal, Spontaneous(Filed from Delivery Summary)    12/01/2018  Details of delivery can be found in separate delivery note.  Patient had a routine postpartum course. Patient is discharged home 12/03/18.  Physical exam  Vitals:   12/02/18 1005 12/02/18 1516 12/02/18 2142 12/03/18 0537  BP:  128/74 133/77 132/78  Pulse:  83 75 92  Resp:  16 16 16   Temp: (!) 97.5 F (36.4 C) 98 F (36.7 C) 98.4 F (36.9 C) 98.6 F (37 C)  TempSrc: Oral Oral Oral Oral  SpO2:  98% 99% 100%  Weight:      Height:       General: alert and no distress Lochia: appropriate Uterine Fundus: firm  Labs: Lab Results  Component Value Date   WBC 14.9 (H) 12/02/2018   HGB 9.2 (L) 12/02/2018   HCT 27.9 (L) 12/02/2018   MCV  86.9 12/02/2018   PLT 199 12/02/2018   CMP Latest Ref Rng & Units 11/30/2018  Glucose 65 - 99 mg/dL -  BUN 6 - 20 mg/dL -  Creatinine 0.44 - 1.00 mg/dL -  Sodium 135 - 145 mmol/L -  Potassium 3.5 - 5.1 mmol/L -  Chloride 101 - 111 mmol/L -  CO2 22 - 32 mmol/L -  Calcium 8.9 - 10.3 mg/dL -  Total Protein 6.5 - 8.1 g/dL 6.9  Total Bilirubin 0.3 - 1.2 mg/dL 0.4  Alkaline Phos 38 - 126 U/L 146(H)  AST 15 - 41 U/L 32  ALT 0 - 44 U/L 15    Discharge instruction: per After Visit Summary and "Baby and Me Booklet".  After visit meds:  Allergies as of 12/03/2018      Reactions   Hydrocodone Itching  Medication List    STOP taking these medications   valACYclovir 1000 MG tablet Commonly known as: VALTREX     TAKE these medications   albuterol 108 (90 Base) MCG/ACT inhaler Commonly known as: VENTOLIN HFA Inhale 2 puffs into the lungs every 2 (two) hours as needed for wheezing or shortness of breath.   ibuprofen 600 MG tablet Commonly known as: ADVIL Take 1 tablet (600 mg total) by mouth every 6 (six) hours as needed.   PrePLUS 27-1 MG Tabs Take 1 tablet by mouth daily.       Diet: routine diet  Activity: Advance as tolerated. Pelvic rest for 6 weeks.   Outpatient follow up:6 weeks Follow up Appt:No future appointments. Follow up Visit:No follow-ups on file.  Postpartum contraception: Undecided  Newborn Data: Live born female  Birth Weight: 7 lb 2.3 oz (3240 g) APGAR: 7, 9  Newborn Delivery   Birth date/time: 12/01/2018 09:34:00 Delivery type: Vaginal, Spontaneous      Baby Feeding: Breast Disposition:home with mother   12/03/2018 Angel ReinJody Bovard-Stuckert, MD

## 2020-01-23 ENCOUNTER — Encounter (HOSPITAL_COMMUNITY): Payer: Self-pay

## 2020-01-23 ENCOUNTER — Emergency Department (HOSPITAL_COMMUNITY): Payer: Medicaid Other

## 2020-01-23 ENCOUNTER — Emergency Department (HOSPITAL_COMMUNITY)
Admission: EM | Admit: 2020-01-23 | Discharge: 2020-01-23 | Disposition: A | Discharge status: Home / Self Care | Payer: Medicaid Other | Attending: Emergency Medicine | Admitting: Emergency Medicine

## 2020-01-23 ENCOUNTER — Other Ambulatory Visit: Payer: Self-pay

## 2020-01-23 DIAGNOSIS — U071 COVID-19: Secondary | ICD-10-CM | POA: Insufficient documentation

## 2020-01-23 DIAGNOSIS — O98519 Other viral diseases complicating pregnancy, unspecified trimester: Secondary | ICD-10-CM | POA: Insufficient documentation

## 2020-01-23 DIAGNOSIS — Z3A00 Weeks of gestation of pregnancy not specified: Secondary | ICD-10-CM | POA: Insufficient documentation

## 2020-01-23 DIAGNOSIS — R000 Tachycardia, unspecified: Secondary | ICD-10-CM | POA: Diagnosis not present

## 2020-01-23 DIAGNOSIS — J45909 Unspecified asthma, uncomplicated: Secondary | ICD-10-CM | POA: Diagnosis not present

## 2020-01-23 DIAGNOSIS — Z3490 Encounter for supervision of normal pregnancy, unspecified, unspecified trimester: Secondary | ICD-10-CM

## 2020-01-23 DIAGNOSIS — Z7722 Contact with and (suspected) exposure to environmental tobacco smoke (acute) (chronic): Secondary | ICD-10-CM | POA: Diagnosis not present

## 2020-01-23 DIAGNOSIS — Z20822 Contact with and (suspected) exposure to covid-19: Secondary | ICD-10-CM

## 2020-01-23 LAB — PREGNANCY, URINE: Preg Test, Ur: POSITIVE — AB

## 2020-01-23 MED ORDER — PRENATAL COMPLETE 14-0.4 MG PO TABS: 1.0000 | ORAL_TABLET | Freq: Every day | ORAL | 0 refills | Status: AC

## 2020-01-23 NOTE — Discharge Instructions (Addendum)
Your pregnancy test was positive today.  You will need to follow-up with your OB/GYN or the 1 listed below. We will contact you with the results of your Covid test if it is positive. You can take Tylenol as needed for body aches or sore throat. Return to the ER if you start to experience abdominal pain, vaginal bleeding, chest pain, shortness of breath, neck pain or stiffness.

## 2020-01-23 NOTE — ED Provider Notes (Signed)
[facility]-EMERGENCY DEPT Provider Note   CSN: [id number] Arrival date & time: 01/23/20  1208     History Chief Complaint  Patient presents with  . Cough  . Sore Throat  . Emesis  . Nasal Congestion    Angel Daugherty is a 24 y.o. female with a past medical history of asthma presenting to the ED with a chief complaint of cough, sore throat and congestion, concern for Covid.  Symptoms have been going on for the past 5 days.  Reports a dry cough that she feels is causing her sore throat.  She has not taken any medications to help with her symptoms.  Denies any chest pain, shortness of breath, productive cough, vomiting.  She is unsure if she could be pregnant.  HPI     Past Medical History:  Diagnosis Date  . Asthma   . Genital herpes 2019   pt. states shes never had an outbreak, and is unsure of dx.   . Vacuum-assisted vaginal delivery 12/01/2018    Patient Active Problem List   Diagnosis Date Noted  . Vacuum-assisted vaginal delivery 12/01/2018  . Preeclampsia, third trimester 11/30/2018    Past Surgical History:  Procedure Laterality Date  . CYST REMOVAL LEG    . PILONIDAL CYST EXCISION  August 2016  . PILONIDAL CYST EXCISION N/A 05/18/2015   Procedure: REXCISION OF PILONIDAL CYST ;  Surgeon: Glenna Fellows, MD;  Location: [facility] ORS;  Service: General;  Laterality: N/A;     OB History    Gravida  3   Para  1   Term  1   Preterm      AB  2   Living  1     SAB  2   TAB      Ectopic      Multiple  0   Live Births  1           Family History  Problem Relation Age of Onset  . Arthritis Mother   . Heart disease Mother   . Hypertension Mother   . Kidney disease Mother     Social History   Tobacco Use  . Smoking status: Passive Smoke Exposure - Never Smoker  . Smokeless tobacco: Never Used  Vaping Use  . Vaping Use: Never used  Substance Use Topics  . Alcohol use: No  . Drug use: No    Home Medications Prior to  Admission medications   Medication Sig Start Date End Date Taking? Authorizing Provider  albuterol (PROVENTIL HFA;VENTOLIN HFA) 108 (90 Base) MCG/ACT inhaler Inhale 2 puffs into the lungs every 2 (two) hours as needed for wheezing or shortness of breath.    [provider]  ibuprofen (ADVIL) 600 MG tablet Take 1 tablet (600 mg total) by mouth every 6 (six) hours as needed. 12/03/18   Bovard-Stuckert, Augusto Gamble, MD  Prenatal Vit-Fe Fumarate-FA (PRENATAL COMPLETE) 14-0.4 MG TABS Take 1 tablet by mouth daily. 01/23/20   Amara Justen, PA-C    Allergies    Hydrocodone  Review of Systems   Review of Systems  Constitutional: Negative for chills and fever.  HENT: Positive for congestion and sore throat.   Respiratory: Positive for cough. Negative for shortness of breath.   Cardiovascular: Negative for chest pain.    Physical Exam Updated Vital Signs BP 136/84 (BP Location: Right Arm)   Pulse (!) 113   Temp 98.6 F (37 C)   Resp 18   Ht 5\' 2"  (1.575 m)  Wt 87.1 kg   LMP 12/10/2019   SpO2 95%   BMI 35.12 kg/m   Physical Exam Vitals and nursing note reviewed.  Constitutional:      General: She is not in acute distress.    Appearance: She is well-developed.  HENT:     Head: Normocephalic and atraumatic.     Nose: Nose normal.     Mouth/Throat:     Tonsils: No tonsillar exudate or tonsillar abscesses.     Comments: Patient does not appear to be in acute distress. No trismus or drooling present. No pooling of secretions. Patient is tolerating secretions and is not in respiratory distress. No neck pain or tenderness to palpation of the neck. Full active and passive range of motion of the neck. No evidence of RPA or PTA. Eyes:     General: No scleral icterus.       Left eye: No discharge.     Conjunctiva/sclera: Conjunctivae normal.  Cardiovascular:     Rate and Rhythm: Regular rhythm. Tachycardia present.     Heart sounds: Normal heart sounds. No murmur heard.  No friction rub.  No gallop.   Pulmonary:     Effort: Pulmonary effort is normal. No respiratory distress.     Breath sounds: Normal breath sounds.  Abdominal:     General: Bowel sounds are normal. There is no distension.     Palpations: Abdomen is soft.     Tenderness: There is no abdominal tenderness. There is no guarding.  Musculoskeletal:        General: Normal range of motion.     Cervical back: Normal range of motion and neck supple.  Skin:    General: Skin is warm and dry.     Findings: No rash.  Neurological:     Mental Status: She is alert.     Motor: No abnormal muscle tone.     Coordination: Coordination normal.     ED Results / Procedures / Treatments   Labs (all labs ordered are listed, but only abnormal results are displayed) Labs Reviewed  PREGNANCY, URINE - Abnormal; Notable for the following components:      Result Value   Preg Test, Ur POSITIVE (*)    All other components within normal limits  SARS CORONAVIRUS 2 (TAT 6-24 HRS)    EKG None  Radiology DG Chest Portable 1 View  Result Date: 01/23/2020 CLINICAL DATA:  Cough EXAM: PORTABLE CHEST 1 VIEW COMPARISON:  08/16/2017 FINDINGS: The heart size and mediastinal contours are within normal limits. Both lungs are clear. The visualized skeletal structures are unremarkable. IMPRESSION: No active disease. Electronically Signed   By: Charlett Nose M.D.   On: 01/23/2020 16:01    Procedures Procedures (including critical care time)  Medications Ordered in ED Medications - No data to display  ED Course  I have reviewed the triage vital signs and the nursing notes.  Pertinent labs & imaging results that were available during my care of the patient were reviewed by me and considered in my medical decision making (see chart for details).  Clinical Course as of Jan 22 1649  Wynelle Link Jan 23, 2020  1623 Preg Test, Ur(!): POSITIVE [HK]    Clinical Course User Index [HK] Dietrich Pates, [place]   MDM Rules/Calculators/A&P                           SALENA ORTLIEB was evaluated in Emergency Department on 01/23/20 for the  symptoms described in the history of present illness. He/she was evaluated in the context of the global COVID-19 pandemic, which necessitated consideration that the patient might be at risk for infection with the SARS-CoV-2 virus that causes COVID-19. Institutional protocols and algorithms that pertain to the evaluation of patients at risk for COVID-19 are in a state of rapid change based on information released by regulatory bodies including the CDC and federal and state organizations. These policies and algorithms were followed during the patient's care in the ED.  24 year old female with a past medical history of asthma presenting to the ED with a chief complaint of cough, sore throat, congestion and concern for Covid infection.  Reports dry cough which she feels like is causing her sore throat.  Denies chest pain, shortness of breath, abdominal pain or vaginal bleeding.  On exam no abnormalities noted of the posterior oropharynx.  No signs of RPA or PTA on exam.  She does have some postnasal drainage seen.  Chest x-ray is unremarkable.  Pregnancy test is positive.  She does not endorse any pregnancy related complications.  This is her second pregnancy and she does have an OB/GYN.  She remains tachycardic. I offered fluids and Tylenol, she declines.   She declines further work-up including blood work or urinalysis with culture.  We will have her follow-up on Covid test results, return precautions given.  All imaging, if done today, including plain films, CT scans, and ultrasounds, independently reviewed by me, and interpretations confirmed via formal radiology reads.  Patient is hemodynamically stable, in NAD, and able to ambulate in the ED. Evaluation does not show pathology that would require ongoing emergent intervention or inpatient treatment. I explained the diagnosis to the patient. Pain has been managed and has no  complaints prior to discharge. Patient is comfortable with above plan and is stable for discharge at this time. All questions were answered prior to disposition. Strict return precautions for returning to the ED were discussed. Encouraged follow up with PCP.   An After Visit Summary was printed and given to the patient.   Portions of this note were generated with Scientist, clinical (histocompatibility and immunogenetics). Dictation errors may occur despite best attempts at proofreading.  Final Clinical Impression(s) / ED Diagnoses Final diagnoses:  Pregnancy, unspecified gestational age  Suspected COVID-19 virus infection    Rx / DC Orders ED Discharge Orders         Ordered    Prenatal Vit-Fe Fumarate-FA (PRENATAL COMPLETE) 14-0.4 MG TABS  Daily     Discontinue  Reprint     01/23/20 1625           Dietrich Pates, PA-C 01/23/20 1650    Charlynne Pander, MD 01/23/20 [phone]

## 2020-01-23 NOTE — ED Triage Notes (Signed)
patient c/o sore throat, runny nose, emesis, and cough x 5 days.  Patient states she may be pregnant.

## 2020-01-24 ENCOUNTER — Telehealth (HOSPITAL_COMMUNITY): Payer: Self-pay

## 2020-01-24 LAB — SARS CORONAVIRUS 2 (TAT 6-24 HRS): SARS Coronavirus 2: POSITIVE — AB

## 2020-03-13 LAB — OB RESULTS CONSOLE HEPATITIS B SURFACE ANTIGEN: Hepatitis B Surface Ag: NEGATIVE

## 2020-03-13 LAB — OB RESULTS CONSOLE GC/CHLAMYDIA
Chlamydia: NEGATIVE
Gonorrhea: NEGATIVE

## 2020-03-13 LAB — OB RESULTS CONSOLE RUBELLA ANTIBODY, IGM: Rubella: IMMUNE

## 2020-03-13 LAB — OB RESULTS CONSOLE RPR: RPR: NONREACTIVE

## 2020-03-13 LAB — OB RESULTS CONSOLE HIV ANTIBODY (ROUTINE TESTING): HIV: NONREACTIVE

## 2020-06-17 NOTE — L&D Delivery Note (Signed)
Operative Delivery Note At 2:05 PM a viable female was delivered via Vaginal, Vacuum [employer], operational).  Presentation: vertex; Position: Right,, Occiput,, Anterior; Station: +3.  Verbal consent: obtained from patient.  Risks and benefits discussed in detail.  Risks include, but are not limited to the risks of anesthesia, bleeding, infection, damage to maternal tissues, fetal cephalhematoma.  There is also the risk of inability to effect vaginal delivery of the head, or shoulder dystocia that cannot be resolved by established maneuvers, leading to the need for emergency cesarean section.  Vacuum assistance offered due to suboptimal pushing.  Vacuum applied in green zone, vtx brought to perineum with 2 pulls and vacuum removed.  We then had to treat like a shoulder dystocia due to ineffective pushing, relieved with mcRoberts and suprapubic pressure.  NICU team at delivery.  APGAR: pending; weight pending.   Placenta status: spontaneous, intact.   Cord:  with the following complications: nuchal x 1 reduced.  Anesthesia:  Epidural Instruments: Kiwi Episiotomy: None Lacerations:  Periurethral-hemostatic, also perineal abrasion Suture Repair: none Est. Blood Loss (mL): 200  Mom to postpartum.  Baby to Couplet care / Skin to Skin.  They would like baby circumcised  Zenaida Niece 09/05/2020, 2:19 PM

## 2020-08-22 LAB — OB RESULTS CONSOLE GBS: GBS: POSITIVE

## 2020-09-04 ENCOUNTER — Encounter (HOSPITAL_COMMUNITY): Payer: Self-pay | Admitting: Obstetrics and Gynecology

## 2020-09-04 ENCOUNTER — Other Ambulatory Visit: Payer: Self-pay

## 2020-09-04 ENCOUNTER — Inpatient Hospital Stay (HOSPITAL_COMMUNITY)
Admission: AD | Admit: 2020-09-04 | Discharge: 2020-09-07 | DRG: 806 | Disposition: A | Discharge status: Home / Self Care | Payer: Medicaid Other | Attending: Obstetrics and Gynecology | Admitting: Obstetrics and Gynecology

## 2020-09-04 DIAGNOSIS — O99214 Obesity complicating childbirth: Secondary | ICD-10-CM | POA: Diagnosis present

## 2020-09-04 DIAGNOSIS — A6000 Herpesviral infection of urogenital system, unspecified: Secondary | ICD-10-CM | POA: Diagnosis present

## 2020-09-04 DIAGNOSIS — R030 Elevated blood-pressure reading, without diagnosis of hypertension: Secondary | ICD-10-CM | POA: Diagnosis present

## 2020-09-04 DIAGNOSIS — Z20822 Contact with and (suspected) exposure to covid-19: Secondary | ICD-10-CM | POA: Diagnosis present

## 2020-09-04 DIAGNOSIS — Z3A38 38 weeks gestation of pregnancy: Secondary | ICD-10-CM

## 2020-09-04 DIAGNOSIS — O134 Gestational [pregnancy-induced] hypertension without significant proteinuria, complicating childbirth: Secondary | ICD-10-CM | POA: Diagnosis present

## 2020-09-04 DIAGNOSIS — O99824 Streptococcus B carrier state complicating childbirth: Secondary | ICD-10-CM | POA: Diagnosis present

## 2020-09-04 DIAGNOSIS — O9832 Other infections with a predominantly sexual mode of transmission complicating childbirth: Secondary | ICD-10-CM | POA: Diagnosis present

## 2020-09-04 DIAGNOSIS — O09893 Supervision of other high risk pregnancies, third trimester: Secondary | ICD-10-CM

## 2020-09-04 LAB — HEPATIC FUNCTION PANEL
ALT: 13 U/L (ref 0–44)
AST: 20 U/L (ref 15–41)
Albumin: 2.5 g/dL — ABNORMAL LOW (ref 3.5–5.0)
Alkaline Phosphatase: 230 U/L — ABNORMAL HIGH (ref 38–126)
Bilirubin, Direct: <0.1 mg/dL (ref 0.0–0.2)
Total Bilirubin: 0.5 mg/dL (ref 0.3–1.2)
Total Protein: 6.8 g/dL (ref 6.5–8.1)

## 2020-09-04 LAB — URIC ACID: Uric Acid, Serum: 5.1 mg/dL (ref 2.5–7.1)

## 2020-09-04 LAB — PROTEIN / CREATININE RATIO, URINE
Creatinine, Urine: 98.97 mg/dL
Protein Creatinine Ratio: 0.15 mg/mg{Cre} (ref 0.00–0.15)
Total Protein, Urine: 15 mg/dL

## 2020-09-04 LAB — TYPE AND SCREEN
ABO/RH(D): O POS
Antibody Screen: NEGATIVE

## 2020-09-04 LAB — SARS CORONAVIRUS 2 (TAT 6-24 HRS): SARS Coronavirus 2: NEGATIVE

## 2020-09-04 LAB — CBC
HCT: 31.1 % — ABNORMAL LOW (ref 36.0–46.0)
Hemoglobin: 10.4 g/dL — ABNORMAL LOW (ref 12.0–15.0)
MCH: 27.4 pg (ref 26.0–34.0)
MCHC: 33.4 g/dL (ref 30.0–36.0)
MCV: 81.8 fL (ref 80.0–100.0)
Platelets: 316 10*3/uL (ref 150–400)
RBC: 3.8 MIL/uL — ABNORMAL LOW (ref 3.87–5.11)
RDW: 14.3 % (ref 11.5–15.5)
WBC: 7.3 10*3/uL (ref 4.0–10.5)
nRBC: 0 % (ref 0.0–0.2)

## 2020-09-04 LAB — LACTATE DEHYDROGENASE: LDH: 128 U/L (ref 98–192)

## 2020-09-04 MED ORDER — TERBUTALINE SULFATE 1 MG/ML IJ SOLN: 0.2500 mg | Freq: Once | INTRAMUSCULAR | Status: DC | PRN

## 2020-09-04 MED ORDER — DIPHENHYDRAMINE HCL 50 MG/ML IJ SOLN: 12.5000 mg | INTRAMUSCULAR | Status: DC | PRN

## 2020-09-04 MED ORDER — MISOPROSTOL 50MCG HALF TABLET
50.0000 ug | ORAL_TABLET | Freq: Once | ORAL | Status: AC
  Administered 2020-09-04: 50 ug via BUCCAL

## 2020-09-04 MED ORDER — OXYTOCIN-SODIUM CHLORIDE 30-0.9 UT/500ML-% IV SOLN
2.5000 [IU]/h | INTRAVENOUS | Status: DC
  Filled 2020-09-04: qty 500

## 2020-09-04 MED ORDER — FENTANYL-BUPIVACAINE-NACL 0.5-0.125-0.9 MG/250ML-% EP SOLN
12.0000 mL/h | EPIDURAL | Status: DC | PRN
  Filled 2020-09-04: qty 250

## 2020-09-04 MED ORDER — EPHEDRINE 5 MG/ML INJ: 10.0000 mg | INTRAVENOUS | Status: DC | PRN

## 2020-09-04 MED ORDER — LACTATED RINGERS IV SOLN: 500.0000 mL | INTRAVENOUS | Status: DC | PRN

## 2020-09-04 MED ORDER — SOD CITRATE-CITRIC ACID 500-334 MG/5ML PO SOLN: 30.0000 mL | ORAL | Status: DC | PRN

## 2020-09-04 MED ORDER — ACETAMINOPHEN 325 MG PO TABS: 650.0000 mg | ORAL_TABLET | ORAL | Status: DC | PRN

## 2020-09-04 MED ORDER — LACTATED RINGERS IV SOLN
500.0000 mL | Freq: Once | INTRAVENOUS | Status: AC
  Administered 2020-09-05: 500 mL via INTRAVENOUS

## 2020-09-04 MED ORDER — LIDOCAINE HCL (PF) 1 % IJ SOLN: 30.0000 mL | INTRAMUSCULAR | Status: DC | PRN

## 2020-09-04 MED ORDER — ONDANSETRON HCL 4 MG/2ML IJ SOLN: 4.0000 mg | Freq: Four times a day (QID) | INTRAMUSCULAR | Status: DC | PRN

## 2020-09-04 MED ORDER — LACTATED RINGERS IV SOLN: INTRAVENOUS | Status: DC

## 2020-09-04 MED ORDER — SODIUM CHLORIDE 0.9 % IV SOLN
5.0000 10*6.[IU] | Freq: Once | INTRAVENOUS | Status: AC
  Filled 2020-09-04: qty 5

## 2020-09-04 MED ORDER — FENTANYL CITRATE (PF) 100 MCG/2ML IJ SOLN: 50.0000 ug | INTRAMUSCULAR | Status: DC | PRN

## 2020-09-04 MED ORDER — PHENYLEPHRINE 40 MCG/ML (10ML) SYRINGE FOR IV PUSH (FOR BLOOD PRESSURE SUPPORT)
80.0000 ug | PREFILLED_SYRINGE | INTRAVENOUS | Status: DC | PRN
  Filled 2020-09-04: qty 10

## 2020-09-04 MED ORDER — MISOPROSTOL 25 MCG QUARTER TABLET
25.0000 ug | ORAL_TABLET | ORAL | Status: DC | PRN
  Administered 2020-09-04: 25 ug via VAGINAL
  Filled 2020-09-04: qty 1

## 2020-09-04 MED ORDER — PENICILLIN G POT IN DEXTROSE 60000 UNIT/ML IV SOLN
3.0000 10*6.[IU] | INTRAVENOUS | Status: DC
  Administered 2020-09-04 – 2020-09-05 (×4): 3 10*6.[IU] via INTRAVENOUS
  Filled 2020-09-04 (×4): qty 50

## 2020-09-04 MED ORDER — MISOPROSTOL 50MCG HALF TABLET
ORAL_TABLET | ORAL | Status: AC
  Administered 2020-09-05: 50 ug via BUCCAL
  Filled 2020-09-04: qty 1

## 2020-09-04 MED ORDER — PHENYLEPHRINE 40 MCG/ML (10ML) SYRINGE FOR IV PUSH (FOR BLOOD PRESSURE SUPPORT): 80.0000 ug | PREFILLED_SYRINGE | INTRAVENOUS | Status: DC | PRN

## 2020-09-04 MED ORDER — OXYTOCIN BOLUS FROM INFUSION
333.0000 mL | Freq: Once | INTRAVENOUS | Status: AC
  Administered 2020-09-05: 333 mL via INTRAVENOUS

## 2020-09-04 NOTE — H&P (Signed)
Angel Daugherty is a 25 y.o. [phone] female presenting at 43 3/7wks for iol. Pt was seen in the office today and noted to have elevated BPs though asymptomatic. Her BP was noted to start rising a week ago. PReE labs were wnl. Pt does have a history of preE in previous pregnancy. This pregnancy was dated per LMP which was confimed with a 13 week [place]. This pregnancy has also been complicated by hx asthma which worsened after she got covid; had pfts done with specialist, maternal obesity, hx of recurrent pregnancy loss - was on baby asa and finally for genital herpes - she started prophylaxis at 36 weeks and has no lesions today.  GBS is positive. No allergy to antibiotics.  OB History    Gravida  4   Para  1   Term  1   Preterm      AB  2   Living  1     SAB  2   IAB      Ectopic      Multiple  0   Live Births  1          Past Medical History:  Diagnosis Date  . Asthma   . Genital herpes 2019   pt. states shes never had an outbreak, and is unsure of dx.   . Vacuum-assisted vaginal delivery 12/01/2018   Past Surgical History:  Procedure Laterality Date  . CYST REMOVAL LEG    . PILONIDAL CYST EXCISION  August 2016  . PILONIDAL CYST EXCISION N/A 05/18/2015   Procedure: REXCISION OF PILONIDAL CYST ;  Surgeon: Glenna Fellows, MD;  Location: [facility];  Service: General;  Laterality: N/A;   Family History: family history includes Arthritis in her mother; Heart disease in her mother; Hypertension in her mother; Kidney disease in her mother. Social History:  reports that she is a non-smoker but has been exposed to tobacco smoke. She has been exposed to tobacco smoke for the past 18.00 years. She has never used smokeless tobacco. She reports that she does not drink alcohol and does not use drugs.     Maternal Diabetes: No Genetic Screening: Normal Maternal Ultrasounds/Referrals: Normal Fetal Ultrasounds or other Referrals:  None Maternal Substance Abuse:  No Significant  Maternal Medications:  None Significant Maternal Lab Results:  Group B Strep positive Other Comments:  None  Review of Systems  Constitutional: Negative for activity change, appetite change, diaphoresis, fatigue, fever and unexpected weight change.  Eyes: Negative for photophobia and visual disturbance.  Respiratory: Negative for cough, chest tightness and shortness of breath.   Cardiovascular: Negative for chest pain and palpitations.  Gastrointestinal: Negative for abdominal pain.  Genitourinary: Negative for pelvic pain.  Musculoskeletal: Negative for back pain.  Neurological: Negative for dizziness and headaches.  Psychiatric/Behavioral: The patient is not nervous/anxious.    Maternal Medical History:  Reason for admission: IOL for elevated BP - suspect PIH  Contractions: Frequency: irregular.   Perceived severity is mild.    Fetal activity: Perceived fetal activity is normal.    Prenatal complications: PIH.   Prenatal Complications - Diabetes: none.    Dilation: 2 Effacement (%): 80 Station: -1 Exam by:: Kathleene Hazel, RN Blood pressure 137/79, pulse 90, temperature 98 F (36.7 C), temperature source Oral, resp. rate 16, height 5\' 3"  (1.6 m), weight 101.6 kg, last menstrual period 12/10/2019, unknown if currently breastfeeding. Maternal Exam:  Abdomen: Patient reports generalized tenderness.  Estimated fetal weight is AGA.   Fetal presentation: vertex  Introitus: Normal vulva. Vulva is negative for condylomata and lesion.  Normal vagina.  Vagina is negative for condylomata.  Pelvis: adequate for delivery.   Cervix: Cervix evaluated by digital exam.     Fetal Exam Fetal Monitor Review: Baseline rate: 145.  Variability: moderate (6-25 bpm).   Pattern: accelerations present and no decelerations.    Fetal State Assessment: Category I - tracings are normal.     Physical Exam Vitals and nursing note reviewed. Exam conducted with a chaperone present.  Constitutional:       Appearance: Normal appearance.  Pulmonary:     Effort: Pulmonary effort is normal.  Abdominal:     Tenderness: There is generalized abdominal tenderness.  Genitourinary:    General: Normal vulva.  Vulva is no lesion.  Musculoskeletal:        General: Normal range of motion.     Cervical back: Normal range of motion.  Skin:    General: Skin is warm.     Capillary Refill: Capillary refill takes 2 to 3 seconds.  Neurological:     General: No focal deficit present.     Mental Status: She is alert and oriented to person, place, and time. Mental status is at baseline.  Psychiatric:        Mood and Affect: Mood normal.        Behavior: Behavior normal.        Thought Content: Thought content normal.        Judgment: Judgment normal.     Prenatal labs: ABO, Rh: --/--/O POS (03/21 1530) Antibody: NEG (03/21 1530) Rubella: Immune (09/27 0000) RPR: Nonreactive (09/27 0000)  HBsAg: Negative (09/27 0000)  HIV: Non-reactive (09/27 0000)  GBS: Positive/-- (03/08 0000)   Assessment/Plan: 43XV [id number] female at 86 3/7wks with PIH for IOL -Admit: ripen with cytotec then augment as indicated -PReE labs -PCN for GBS treatment once in active labor -Monitor BP and treat as indicated - Anticipate svd    Cathrine Muster 09/04/2020, 6:34 PM

## 2020-09-05 ENCOUNTER — Encounter (HOSPITAL_COMMUNITY): Payer: Self-pay | Admitting: Obstetrics and Gynecology

## 2020-09-05 ENCOUNTER — Inpatient Hospital Stay (HOSPITAL_COMMUNITY): Payer: Medicaid Other | Admitting: Anesthesiology

## 2020-09-05 LAB — CBC WITH DIFFERENTIAL/PLATELET
Abs Immature Granulocytes: 0.05 10*3/uL (ref 0.00–0.07)
Basophils Absolute: 0 10*3/uL (ref 0.0–0.1)
Basophils Relative: 0 %
Eosinophils Absolute: 0 10*3/uL (ref 0.0–0.5)
Eosinophils Relative: 0 %
HCT: 29.5 % — ABNORMAL LOW (ref 36.0–46.0)
Hemoglobin: 9.8 g/dL — ABNORMAL LOW (ref 12.0–15.0)
Immature Granulocytes: 1 %
Lymphocytes Relative: 11 %
Lymphs Abs: 1.2 10*3/uL (ref 0.7–4.0)
MCH: 27.4 pg (ref 26.0–34.0)
MCHC: 33.2 g/dL (ref 30.0–36.0)
MCV: 82.4 fL (ref 80.0–100.0)
Monocytes Absolute: 0.6 10*3/uL (ref 0.1–1.0)
Monocytes Relative: 6 %
Neutro Abs: 9 10*3/uL — ABNORMAL HIGH (ref 1.7–7.7)
Neutrophils Relative %: 82 %
Platelets: 307 10*3/uL (ref 150–400)
RBC: 3.58 MIL/uL — ABNORMAL LOW (ref 3.87–5.11)
RDW: 14.5 % (ref 11.5–15.5)
WBC: 10.9 10*3/uL — ABNORMAL HIGH (ref 4.0–10.5)
nRBC: 0 % (ref 0.0–0.2)

## 2020-09-05 LAB — RPR: RPR Ser Ql: NONREACTIVE

## 2020-09-05 MED ORDER — PRENATAL MULTIVITAMIN CH
1.0000 | ORAL_TABLET | Freq: Every day | ORAL | Status: DC
  Administered 2020-09-06: 1 via ORAL
  Filled 2020-09-05: qty 1

## 2020-09-05 MED ORDER — FENTANYL-BUPIVACAINE-NACL 0.5-0.125-0.9 MG/250ML-% EP SOLN
EPIDURAL | Status: DC | PRN
  Administered 2020-09-05: 12 mL/h via EPIDURAL

## 2020-09-05 MED ORDER — METHYLERGONOVINE MALEATE 0.2 MG/ML IJ SOLN: 0.2000 mg | INTRAMUSCULAR | Status: DC | PRN

## 2020-09-05 MED ORDER — DIPHENHYDRAMINE HCL 25 MG PO CAPS: 25.0000 mg | ORAL_CAPSULE | Freq: Four times a day (QID) | ORAL | Status: DC | PRN

## 2020-09-05 MED ORDER — FENTANYL-BUPIVACAINE-NACL 0.5-0.125-0.9 MG/250ML-% EP SOLN: 12.0000 mL/h | EPIDURAL | Status: DC | PRN

## 2020-09-05 MED ORDER — ONDANSETRON HCL 4 MG/2ML IJ SOLN: 4.0000 mg | INTRAMUSCULAR | Status: DC | PRN

## 2020-09-05 MED ORDER — LACTATED RINGERS IV SOLN: 500.0000 mL | Freq: Once | INTRAVENOUS | Status: DC

## 2020-09-05 MED ORDER — ONDANSETRON HCL 4 MG PO TABS: 4.0000 mg | ORAL_TABLET | ORAL | Status: DC | PRN

## 2020-09-05 MED ORDER — PHENYLEPHRINE 40 MCG/ML (10ML) SYRINGE FOR IV PUSH (FOR BLOOD PRESSURE SUPPORT): 80.0000 ug | PREFILLED_SYRINGE | INTRAVENOUS | Status: DC | PRN

## 2020-09-05 MED ORDER — OXYTOCIN-SODIUM CHLORIDE 30-0.9 UT/500ML-% IV SOLN
1.0000 m[IU]/min | INTRAVENOUS | Status: DC
  Administered 2020-09-05: 2 m[IU]/min via INTRAVENOUS

## 2020-09-05 MED ORDER — MAGNESIUM HYDROXIDE 400 MG/5ML PO SUSP: 30.0000 mL | ORAL | Status: DC | PRN

## 2020-09-05 MED ORDER — PHENYLEPHRINE 40 MCG/ML (10ML) SYRINGE FOR IV PUSH (FOR BLOOD PRESSURE SUPPORT)
80.0000 ug | PREFILLED_SYRINGE | INTRAVENOUS | Status: DC | PRN
Start: 2020-09-05 — End: 2020-09-05

## 2020-09-05 MED ORDER — BENZOCAINE-MENTHOL 20-0.5 % EX AERO
1.0000 "application " | INHALATION_SPRAY | CUTANEOUS | Status: DC | PRN
  Administered 2020-09-05: 1 via TOPICAL
  Filled 2020-09-05: qty 56

## 2020-09-05 MED ORDER — EPHEDRINE 5 MG/ML INJ
10.0000 mg | INTRAVENOUS | Status: DC | PRN
Start: 2020-09-05 — End: 2020-09-05

## 2020-09-05 MED ORDER — ALBUTEROL SULFATE (2.5 MG/3ML) 0.083% IN NEBU: 3.0000 mL | INHALATION_SOLUTION | RESPIRATORY_TRACT | Status: DC | PRN

## 2020-09-05 MED ORDER — DIBUCAINE (PERIANAL) 1 % EX OINT: 1.0000 "application " | TOPICAL_OINTMENT | CUTANEOUS | Status: DC | PRN

## 2020-09-05 MED ORDER — OXYCODONE HCL 5 MG PO TABS: 5.0000 mg | ORAL_TABLET | ORAL | Status: DC | PRN

## 2020-09-05 MED ORDER — WITCH HAZEL-GLYCERIN EX PADS
1.0000 | MEDICATED_PAD | CUTANEOUS | Status: DC | PRN
Start: 2020-09-05 — End: 2020-09-07

## 2020-09-05 MED ORDER — SENNOSIDES-DOCUSATE SODIUM 8.6-50 MG PO TABS
2.0000 | ORAL_TABLET | Freq: Every day | ORAL | Status: DC
  Administered 2020-09-06 – 2020-09-07 (×2): 2 via ORAL
  Filled 2020-09-05 (×2): qty 2

## 2020-09-05 MED ORDER — TERBUTALINE SULFATE 1 MG/ML IJ SOLN: 0.2500 mg | Freq: Once | INTRAMUSCULAR | Status: DC | PRN

## 2020-09-05 MED ORDER — ACETAMINOPHEN 325 MG PO TABS
650.0000 mg | ORAL_TABLET | ORAL | Status: DC | PRN
  Administered 2020-09-05: 650 mg via ORAL
  Filled 2020-09-05: qty 2

## 2020-09-05 MED ORDER — DIPHENHYDRAMINE HCL 50 MG/ML IJ SOLN: 12.5000 mg | INTRAMUSCULAR | Status: DC | PRN

## 2020-09-05 MED ORDER — ZOLPIDEM TARTRATE 5 MG PO TABS
5.0000 mg | ORAL_TABLET | Freq: Every evening | ORAL | Status: DC | PRN
Start: 2020-09-05 — End: 2020-09-07

## 2020-09-05 MED ORDER — IBUPROFEN 600 MG PO TABS
600.0000 mg | ORAL_TABLET | Freq: Four times a day (QID) | ORAL | Status: DC
  Administered 2020-09-05 – 2020-09-07 (×7): 600 mg via ORAL
  Filled 2020-09-05 (×7): qty 1

## 2020-09-05 MED ORDER — EPHEDRINE 5 MG/ML INJ: 10.0000 mg | INTRAVENOUS | Status: DC | PRN

## 2020-09-05 MED ORDER — MEASLES, MUMPS & RUBELLA VAC IJ SOLR: 0.5000 mL | Freq: Once | INTRAMUSCULAR | Status: DC

## 2020-09-05 MED ORDER — COCONUT OIL OIL
1.0000 "application " | TOPICAL_OIL | Status: DC | PRN
  Administered 2020-09-06: 1 via TOPICAL

## 2020-09-05 MED ORDER — LIDOCAINE HCL (PF) 1 % IJ SOLN
INTRAMUSCULAR | Status: DC | PRN
  Administered 2020-09-05: 2 mL via EPIDURAL
  Administered 2020-09-05: 10 mL via EPIDURAL

## 2020-09-05 MED ORDER — TETANUS-DIPHTH-ACELL PERTUSSIS 5-2.5-18.5 LF-MCG/0.5 IM SUSY: 0.5000 mL | PREFILLED_SYRINGE | Freq: Once | INTRAMUSCULAR | Status: DC

## 2020-09-05 MED ORDER — METHYLERGONOVINE MALEATE 0.2 MG PO TABS: 0.2000 mg | ORAL_TABLET | ORAL | Status: DC | PRN

## 2020-09-05 MED ORDER — SIMETHICONE 80 MG PO CHEW: 80.0000 mg | CHEWABLE_TABLET | ORAL | Status: DC | PRN

## 2020-09-05 MED ORDER — MISOPROSTOL 50MCG HALF TABLET
50.0000 ug | ORAL_TABLET | Freq: Once | ORAL | Status: AC
  Filled 2020-09-05: qty 1

## 2020-09-05 NOTE — Lactation Note (Signed)
This note was copied from a baby's chart. Lactation Consultation Note  Patient Name: Angel Daugherty Date: 09/05/2020 Reason for consult: Initial assessment;Mother's request;Early term 37-38.6wks;Other (Comment) (PIH, ASA) Age:24 hours  LC reviewed with Mom feeding based on cues. LC assisted Mom latching infant on the left breast in football with sings of milk transfer. LC reviewed with Mom how tracking urine and stool and the transition in stool color helps to know how much infant taking for a baby that is EBF.  Further support for parents once they are transferred to the floor.   Maternal Data Does the patient have breastfeeding experience prior to this delivery?: Yes How long did the patient breastfeed?: 4 months Mom stopped on her own concerned infant was not getting enough.  Feeding Mother's Current Feeding Choice: Breast Milk  LATCH Score Latch: Repeated attempts needed to sustain latch, nipple held in mouth throughout feeding, stimulation needed to elicit sucking reflex.  Audible Swallowing: Spontaneous and intermittent  Type of Nipple: Everted at rest and after stimulation  Comfort (Breast/Nipple): Soft / non-tender  Hold (Positioning): Assistance needed to correctly position infant at breast and maintain latch.  LATCH Score: 8   Lactation Tools Discussed/Used    Interventions Interventions: Breast feeding basics reviewed;Breast compression;Assisted with latch;Adjust position;Skin to skin;Support pillows;Breast massage;Position options;Education  Discharge    Consult Status Consult Status: Follow-up Date: 09/06/20 Follow-up type: In-patient    Angel Daugherty  [name] 09/05/2020, 3:02 PM

## 2020-09-05 NOTE — Progress Notes (Signed)
Comfortable with epidural Afeb, VSS FHT-120-130, Cat I, ctx q 2-3 min VE-C/C/+1, vtx  Will start pushing, anticipate SVD

## 2020-09-05 NOTE — Anesthesia Preprocedure Evaluation (Signed)
Anesthesia Evaluation  Patient identified by MRN, date of birth, ID band Patient awake    Reviewed: Allergy & Precautions, Patient's Chart, lab work & pertinent test results  Airway Mallampati: II  TM Distance: >3 FB Neck ROM: Full    Dental no notable dental hx.    Pulmonary asthma (well controlled) ,    Pulmonary exam normal breath sounds clear to auscultation       Cardiovascular hypertension (IOL for HTN in the setting of hx of preE with prior pregnancy, BPs since admission have been normal/mildly elevated), Normal cardiovascular exam Rhythm:Regular Rate:Normal     Neuro/Psych negative neurological ROS  negative psych ROS   GI/Hepatic negative GI ROS, Neg liver ROS,   Endo/Other  Morbid obesityBMI 40  Renal/GU negative Renal ROS  negative genitourinary   Musculoskeletal negative musculoskeletal ROS (+)   Abdominal (+) + obese,   Peds negative pediatric ROS (+)  Hematology negative hematology ROS (+)   Anesthesia Other Findings   Reproductive/Obstetrics (+) Pregnancy 1 prior VAVD, epidural worked well                             Anesthesia Physical Anesthesia Plan  ASA: III and emergent  Anesthesia Plan: Epidural   Post-op Pain Management:    Induction:   PONV Risk Score and Plan: 2  Airway Management Planned: Natural Airway  Additional Equipment: None  Intra-op Plan:   Post-operative Plan:   Informed Consent: I have reviewed the patients History and Physical, chart, labs and discussed the procedure including the risks, benefits and alternatives for the proposed anesthesia with the patient or authorized representative who has indicated his/her understanding and acceptance.       Plan Discussed with:   Anesthesia Plan Comments:         Anesthesia Quick Evaluation

## 2020-09-05 NOTE — Progress Notes (Signed)
Feeling ctx Afeb, VSS, BP 120-140/60-90 FHT-120-130, Cat I, ctx q 2-4 min VE-3/70/-2, vtx, AROM clear  Continue pitocin, monitor progress, continue PCN for GBS +, BP ok so far

## 2020-09-05 NOTE — Lactation Note (Signed)
This note was copied from a baby's chart. Lactation Consultation Note  Patient Name: Angel Daugherty HOOIL'N Date: 09/05/2020 Reason for consult: Initial assessment;Mother's request;Difficult latch;Early term 37-38.6wks;Other (Comment) (Herpes on Valtrex, PIH, ASA) Age:24 hours  LC arrived parents given 15 ml of formula. LC not able to see a latch. LC reviewed feeding based on cues, I and O sheet and LC brochure of inpatient and outpatient services.   LC set up a manual pump.  Mom latched a few times but infant still showing hunger cues. Mom stated she had some pain with the latch, had a compression stripe on the left breasts.   LC reviewed with Mom how to get a deep latch and went over different holds.   Plan 1. To feed based on cues 8-12x in 24 hr period no more than 4 hrs without an attempt. Mom to offer both breasts, STS and look for swallows.         2. Dad to paced bottle feed with yellow slow flow nipple EBM or formula.          3. Mom to pump using manual pump           All questions answered at the end of the feeding.   Maternal Data Has patient been taught Hand Expression?: Yes Does the patient have breastfeeding experience prior to this delivery?: Yes How long did the patient breastfeed?: few months Mom worried about her milk supply  Feeding Mother's Current Feeding Choice: Breast Milk and Formula Nipple Type: Slow - flow  LATCH Score                    Lactation Tools Discussed/Used Tools: Flanges;Pump Flange Size: 24 Breast pump type: Manual Pump Education: Setup, frequency, and cleaning;Milk Storage Reason for Pumping: increase stimulation Pumping frequency: every 3 hrs for 10 minutes  Interventions Interventions: Breast feeding basics reviewed;Skin to skin;Breast compression;Position options;Expressed milk;Coconut oil;Hand pump;Education  Discharge Pump: Manual  Consult Status Consult Status: Follow-up Date: 09/06/20 Follow-up type:  In-patient    Alyona Romack  [name] 09/05/2020, 11:43 PM

## 2020-09-05 NOTE — Anesthesia Procedure Notes (Signed)
Epidural Patient location during procedure: OB Start time: 09/05/2020 9:23 AM End time: 09/05/2020 9:35 AM  Staffing Anesthesiologist: Lannie Fields, DO Performed: anesthesiologist   Preanesthetic Checklist Completed: patient identified, IV checked, risks and benefits discussed, monitors and equipment checked, pre-op evaluation and timeout performed  Epidural Patient position: sitting Prep: DuraPrep and site prepped and draped Patient monitoring: continuous pulse ox, blood pressure, heart rate and cardiac monitor Approach: midline Location: L3-L4 Injection technique: LOR air  Needle:  Needle type: Tuohy  Needle gauge: 17 G Needle length: 9 cm Needle insertion depth: 7 cm Catheter type: closed end flexible Catheter size: 19 Gauge Catheter at skin depth: 12 cm Test dose: negative  Assessment Sensory level: T8 Events: blood not aspirated, injection not painful, no injection resistance, no paresthesia and negative IV test  Additional Notes Patient identified. Risks/Benefits/Options discussed with patient including but not limited to bleeding, infection, nerve damage, paralysis, failed block, incomplete pain control, headache, blood pressure changes, nausea, vomiting, reactions to medication both or allergic, itching and postpartum back pain. Confirmed with bedside nurse the patient's most recent platelet count. Confirmed with patient that they are not currently taking any anticoagulation, have any bleeding history or any family history of bleeding disorders. Patient expressed understanding and wished to proceed. All questions were answered. Sterile technique was used throughout the entire procedure. Please see nursing notes for vital signs. Test dose was given through epidural catheter and negative prior to continuing to dose epidural or start infusion. Warning signs of high block given to the patient including shortness of breath, tingling/numbness in hands, complete motor  block, or any concerning symptoms with instructions to call for help. Patient was given instructions on fall risk and not to get out of bed. All questions and concerns addressed with instructions to call with any issues or inadequate analgesia.  Reason for block:procedure for pain

## 2020-09-06 NOTE — Progress Notes (Signed)
POSTPARTUM PROGRESS NOTE  Post Partum Day #1  Subjective:  No acute events overnight.  Pt denies problems with ambulating, voiding or po intake.  She denies nausea or vomiting.  Pain is well controlled.  Lochia Minimal.   Objective: Blood pressure 130/72, pulse 79, temperature 98.1 F (36.7 C), temperature source Oral, resp. rate 16, height 5\' 3"  (1.6 m), weight 101.6 kg, last menstrual period 12/10/2019, SpO2 99 %, unknown if currently breastfeeding.  Physical Exam:  General: alert, cooperative and no distress Lochia:normal flow Chest: CTAB Heart: RRR no m/r/g Abdomen: +BS, soft, nontender Uterine Fundus: firm, 2cm below umbilicus Extremities: neg edema, neg calf TTP BL, neg Homans BL  Recent Labs    09/04/20 1634 09/05/20 1430  HGB 10.4* 9.8*  HCT 31.1* 29.5*    Assessment/Plan:  ASSESSMENT: Angel Daugherty is a 25 y.o. 30 s/p VAVD @ [redacted]w[redacted]d for maternal fatigue. PNC c/b asthma, h/o PreE, HSV (on suppression).   Breastfeeding and Circumcision prior to discharge   LOS: 2 days

## 2020-09-06 NOTE — Anesthesia Postprocedure Evaluation (Signed)
Anesthesia Post Note  Patient: Angel Daugherty  Procedure(s) Performed: AN AD HOC LABOR EPIDURAL     Patient location during evaluation: Mother Baby Anesthesia Type: Epidural Level of consciousness: awake, awake and alert and oriented Pain management: pain level controlled Vital Signs Assessment: post-procedure vital signs reviewed and stable Respiratory status: spontaneous breathing and respiratory function stable Cardiovascular status: blood pressure returned to baseline Postop Assessment: no headache, epidural receding, patient able to bend at knees, adequate PO intake, no backache, no apparent nausea or vomiting and able to ambulate Anesthetic complications: no   No complications documented.  Last Vitals:  Vitals:   09/06/20 0200 09/06/20 0535  BP: 135/69 130/72  Pulse: 90 79  Resp: 18 16  Temp: 36.7 C 36.7 C  SpO2: 98% 99%    Last Pain:  Vitals:   09/06/20 0535  TempSrc: Oral  PainSc: 0-No pain   Pain Goal:                   Cleda Clarks

## 2020-09-07 MED ORDER — IBUPROFEN 600 MG PO TABS: 600.0000 mg | ORAL_TABLET | Freq: Four times a day (QID) | ORAL | 0 refills | Status: AC

## 2020-09-07 MED ORDER — ACETAMINOPHEN 325 MG PO TABS: 650.0000 mg | ORAL_TABLET | ORAL | 1 refills | Status: AC | PRN

## 2020-09-07 NOTE — Progress Notes (Signed)
Notified patient Dr Senaida Ores would like her to have a BP check next week in the office, she declined to see [facility] berfore discharge today.

## 2020-09-07 NOTE — Progress Notes (Addendum)
Post Partum Day 2 Subjective: no complaints, up ad lib and tolerating PO  Objective: Blood pressure 108/88, pulse 89, temperature 98.2 F (36.8 C), temperature source Axillary, resp. rate 18, height 5\' 3"  (1.6 m), weight 101.6 kg, last menstrual period 12/10/2019, SpO2 100 %, unknown if currently breastfeeding.  Physical Exam:  General: alert and cooperative Lochia: appropriate Uterine Fundus: firm   Recent Labs    09/04/20 1634 09/05/20 1430  HGB 10.4* 9.8*  HCT 31.1* 29.5*    Assessment/Plan: Discharge home  Most BP WNL but had a couple BP to 130/90 range yesterday so will have come for BP check in office in one week.   LOS: 3 days   09/07/20 09/07/2020, 10:32 AM

## 2020-09-07 NOTE — Discharge Summary (Addendum)
Postpartum Discharge Summary       Patient Name: Angel Daugherty DOB: 1995-08-11 MRN: [id number]  Date of admission: 09/04/2020 Delivery date:09/05/2020  Delivering provider: Jackelyn Knife, TODD  Date of discharge: 09/07/2020  Admitting diagnosis: Prior pregnancy complicated by [facility], antepartum, third trimester [O09.893] Intrauterine pregnancy: [redacted]w[redacted]d     Secondary diagnosis:  Active Problems:   Prior pregnancy complicated by PIH, antepartum, third trimester  Additional problems: GBS positve    Discharge diagnosis: Term Pregnancy Delivered                                              Post partum procedures:none Augmentation: AROM, Pitocin and Cytotec Complications: None  Hospital course: Induction of Labor With Vaginal Delivery   25 y.o. yo [id number] at [redacted]w[redacted]d was admitted to the hospital 09/04/2020 for induction of labor.  Indication for induction: borderline blood pressure elevations.  Patient had an uncomplicated labor course as follows: Membrane Rupture Time/Date: 7:56 AM ,09/05/2020   Delivery Method:Vaginal, Vacuum (Extractor)  Episiotomy: None  Lacerations:  Periurethral  Details of delivery can be found in separate delivery note.  Patient had a routine postpartum course. Most BP WNL but had a couple BP to 130/90 range yesterday so will have come for BP check in office in one week. Patient is discharged home 09/07/20.  Newborn Data: Birth date:09/05/2020  Birth time:2:05 PM  Gender:Female  Living status:Living  Apgars:8 ,8  Weight:3269 g   Magnesium Sulfate received: No BMZ received: No Rhophylac:No   Physical exam  Vitals:   09/06/20 1459 09/06/20 2118 09/07/20 0526 09/07/20 0829  BP: 136/70 (!) 136/92 (!) 139/96 108/88  Pulse: 90 82 82 89  Resp: 16 16 18    Temp: 97.9 F (36.6 C) 98 F (36.7 C) 98.2 F (36.8 C)   TempSrc: Oral Axillary Axillary   SpO2: 99% 100% 100%   Weight:      Height:       General: alert and cooperative Lochia: appropriate Uterine  Fundus: firm  Labs: Lab Results  Component Value Date   WBC 10.9 (H) 09/05/2020   HGB 9.8 (L) 09/05/2020   HCT 29.5 (L) 09/05/2020   MCV 82.4 09/05/2020   PLT 307 09/05/2020   CMP Latest Ref Rng & Units 09/04/2020  Glucose 65 - 99 mg/dL -  BUN 6 - 20 mg/dL -  Creatinine 09/06/2020 - 1.75 mg/dL -  Sodium 1.02 - 585 mmol/L -  Potassium 3.5 - 5.1 mmol/L -  Chloride 101 - 111 mmol/L -  CO2 22 - 32 mmol/L -  Calcium 8.9 - 10.3 mg/dL -  Total Protein 6.5 - 8.1 g/dL 6.8  Total Bilirubin 0.3 - 1.2 mg/dL 0.5  Alkaline Phos 38 - 126 U/L 230(H)  AST 15 - 41 U/L 20  ALT 0 - 44 U/L 13   Edinburgh Score: Edinburgh Postnatal Depression Scale Screening Tool 09/05/2020  I have been able to laugh and see the funny side of things. 0  I have looked forward with enjoyment to things. 0  I have blamed myself unnecessarily when things went wrong. 1  I have been anxious or worried for no good reason. 0  I have felt scared or panicky for no good reason. 0  Things have been getting on top of me. 0  I have been so unhappy that I have had difficulty sleeping. 0  I have felt sad or miserable. 0  I have been so unhappy that I have been crying. 0  The thought of harming myself has occurred to me. 0  Edinburgh Postnatal Depression Scale Total 1     After visit meds:  Allergies as of 09/07/2020      Reactions   Hydrocodone Itching      Medication List    STOP taking these medications   valACYclovir 500 MG tablet Commonly known as: VALTREX     TAKE these medications   acetaminophen 325 MG tablet Commonly known as: Tylenol Take 2 tablets (650 mg total) by mouth every 4 (four) hours as needed (for pain scale < 4).   albuterol 108 (90 Base) MCG/ACT inhaler Commonly known as: VENTOLIN HFA Inhale 2 puffs into the lungs every 2 (two) hours as needed for wheezing or shortness of breath.   Fusion 65-65-25-30 MG Caps Take 1 capsule by mouth daily.   ibuprofen 600 MG tablet Commonly known as:  ADVIL Take 1 tablet (600 mg total) by mouth every 6 (six) hours.   Prenatal Complete 14-0.4 MG Tabs Take 1 tablet by mouth daily.        Discharge home in stable condition Infant Feeding: Breast Infant Disposition:home with mother Discharge instruction: per After Visit Summary and Postpartum booklet. Activity: Advance as tolerated. Pelvic rest for 6 weeks.  Diet: routine diet Future Appointments:No future appointments. Follow up Visit:  Follow-up Information    Meisinger, Todd, MD. Schedule an appointment as soon as possible for a visit in 6 week(s).   Specialty: Obstetrics and Gynecology Why: postpartum Contact information: [address] [place] [address] [phone]                Please schedule this patient for a In person postpartum visit in 1 week with the following provider: MD.  Delivery mode:  Vaginal, Vacuum (Extractor)  Anticipated Birth Control:  POPs   09/07/2020 Oliver Pila, MD
